# Patient Record
Sex: Female | Born: 1975 | Race: White | Hispanic: No | Marital: Married | Smoking: Current every day smoker
Health system: Southern US, Community
[De-identification: ages and names within clinical notes are randomized; demographics above are authoritative.]

---

## 2020-04-22 ENCOUNTER — Ambulatory Visit
Admission: EM | Admit: 2020-04-22 | Discharge: 2020-04-22 | Disposition: A | Discharge status: Home / Self Care | Payer: Self-pay

## 2020-04-22 ENCOUNTER — Other Ambulatory Visit: Payer: Self-pay

## 2020-04-22 ENCOUNTER — Encounter: Payer: Self-pay | Admitting: Emergency Medicine

## 2020-04-22 DIAGNOSIS — M62830 Muscle spasm of back: Secondary | ICD-10-CM

## 2020-04-22 DIAGNOSIS — M542 Cervicalgia: Secondary | ICD-10-CM

## 2020-04-22 DIAGNOSIS — R519 Headache, unspecified: Secondary | ICD-10-CM

## 2020-04-22 DIAGNOSIS — M549 Dorsalgia, unspecified: Secondary | ICD-10-CM

## 2020-04-22 MED ORDER — MELOXICAM 15 MG PO TABS
15.0000 mg | ORAL_TABLET | Freq: Every day | ORAL | 0 refills | Status: DC
Start: 2020-04-22 — End: 2020-04-25

## 2020-04-22 MED ORDER — KETOROLAC TROMETHAMINE 60 MG/2ML IM SOLN
60.0000 mg | Freq: Once | INTRAMUSCULAR | Status: AC
  Administered 2020-04-22: 60 mg via INTRAMUSCULAR

## 2020-04-22 MED ORDER — CYCLOBENZAPRINE HCL 10 MG PO TABS
10.0000 mg | ORAL_TABLET | Freq: Every day | ORAL | 0 refills | Status: DC
Start: 2020-04-22 — End: 2020-04-25

## 2020-04-22 MED ORDER — PREDNISONE 20 MG PO TABS
20.0000 mg | ORAL_TABLET | Freq: Two times a day (BID) | ORAL | 0 refills | Status: AC
Start: 2020-04-22 — End: 2020-04-27

## 2020-04-22 MED ORDER — DEXAMETHASONE SODIUM PHOSPHATE 10 MG/ML IJ SOLN
10.0000 mg | Freq: Once | INTRAMUSCULAR | Status: AC
  Administered 2020-04-22: 10 mg via INTRAMUSCULAR
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
## 2020-04-25 ENCOUNTER — Ambulatory Visit (HOSPITAL_COMMUNITY): Payer: 59

## 2020-04-25 ENCOUNTER — Ambulatory Visit (HOSPITAL_COMMUNITY)
Admission: EM | Admit: 2020-04-25 | Discharge: 2020-04-25 | Disposition: A | Discharge status: Home / Self Care | Payer: 59 | Attending: Internal Medicine | Admitting: Internal Medicine

## 2020-04-25 ENCOUNTER — Encounter (HOSPITAL_COMMUNITY): Payer: Self-pay

## 2020-04-25 ENCOUNTER — Ambulatory Visit (INDEPENDENT_AMBULATORY_CARE_PROVIDER_SITE_OTHER): Payer: 59

## 2020-04-25 DIAGNOSIS — S161XXA Strain of muscle, fascia and tendon at neck level, initial encounter: Secondary | ICD-10-CM

## 2020-04-25 DIAGNOSIS — M545 Low back pain, unspecified: Secondary | ICD-10-CM

## 2020-04-25 MED ORDER — KETOROLAC TROMETHAMINE 30 MG/ML IJ SOLN
30.0000 mg | Freq: Once | INTRAMUSCULAR | Status: AC
  Administered 2020-04-25: 30 mg via INTRAMUSCULAR

## 2020-04-25 MED ORDER — CYCLOBENZAPRINE HCL 10 MG PO TABS
10.0000 mg | ORAL_TABLET | Freq: Three times a day (TID) | ORAL | 0 refills | Status: DC | PRN
Start: 2020-04-25 — End: 2020-06-07

## 2020-04-25 MED ORDER — HYDROCODONE-ACETAMINOPHEN 5-325 MG PO TABS: 1.0000 | ORAL_TABLET | Freq: Four times a day (QID) | ORAL | 0 refills | Status: AC | PRN

## 2020-04-25 MED ORDER — KETOROLAC TROMETHAMINE 30 MG/ML IJ SOLN
INTRAMUSCULAR | Status: AC
  Filled 2020-04-25: qty 1

## 2020-04-25 MED ORDER — IBUPROFEN 600 MG PO TABS
600.0000 mg | ORAL_TABLET | Freq: Four times a day (QID) | ORAL | 0 refills | Status: DC | PRN
Start: 2020-04-25 — End: 2020-05-16
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
## 2020-04-27 ENCOUNTER — Telehealth: Payer: Self-pay

## 2020-04-27 ENCOUNTER — Other Ambulatory Visit: Payer: Self-pay | Admitting: Chiropractic Medicine

## 2020-04-27 ENCOUNTER — Other Ambulatory Visit (HOSPITAL_COMMUNITY): Payer: Self-pay | Admitting: Chiropractic Medicine

## 2020-04-27 DIAGNOSIS — M5441 Lumbago with sciatica, right side: Secondary | ICD-10-CM
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
## 2020-04-30 ENCOUNTER — Telehealth (HOSPITAL_COMMUNITY): Payer: Self-pay

## 2020-05-04 ENCOUNTER — Ambulatory Visit: Payer: 59 | Admitting: Family Medicine

## 2020-05-04 ENCOUNTER — Other Ambulatory Visit: Payer: Self-pay

## 2020-05-04 ENCOUNTER — Encounter: Payer: Self-pay | Admitting: Family Medicine

## 2020-05-04 DIAGNOSIS — M545 Low back pain, unspecified: Secondary | ICD-10-CM

## 2020-05-04 DIAGNOSIS — M542 Cervicalgia: Secondary | ICD-10-CM | POA: Diagnosis not present

## 2020-05-04 MED ORDER — PREDNISONE 10 MG PO TABS
ORAL_TABLET | ORAL | 0 refills | Status: DC
Start: 2020-05-04 — End: 2020-06-07

## 2020-05-04 MED ORDER — BACLOFEN 10 MG PO TABS: 5.0000 mg | ORAL_TABLET | Freq: Three times a day (TID) | ORAL | 3 refills | Status: DC | PRN

## 2020-05-04 MED ORDER — TRAMADOL HCL 50 MG PO TABS: 50.0000 mg | ORAL_TABLET | Freq: Four times a day (QID) | ORAL | 0 refills | Status: DC | PRN
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
## 2020-05-10 ENCOUNTER — Ambulatory Visit (HOSPITAL_COMMUNITY): Payer: 59

## 2020-05-16 ENCOUNTER — Encounter: Payer: Self-pay | Admitting: Family Medicine

## 2020-05-16 MED ORDER — IBUPROFEN 600 MG PO TABS: 600.0000 mg | ORAL_TABLET | Freq: Three times a day (TID) | ORAL | 1 refills | Status: DC | PRN

## 2020-05-16 MED ORDER — TRAMADOL HCL 50 MG PO TABS: 25.0000 mg | ORAL_TABLET | Freq: Four times a day (QID) | ORAL | 0 refills | Status: DC | PRN

## 2020-05-16 NOTE — Addendum Note (Signed)
Addended by: Lillia Carmel on: 05/16/2020 03:20 PM   Modules accepted: Orders

## 2020-06-02 MED FILL — BACLOFEN 10 MG TABS: 10 | 10 days supply | Qty: 30 | Fill #1

## 2020-06-07 ENCOUNTER — Encounter: Payer: Self-pay | Admitting: Family Medicine

## 2020-06-07 ENCOUNTER — Other Ambulatory Visit: Payer: Self-pay

## 2020-06-07 ENCOUNTER — Ambulatory Visit (INDEPENDENT_AMBULATORY_CARE_PROVIDER_SITE_OTHER): Payer: 59 | Admitting: Family Medicine

## 2020-06-07 DIAGNOSIS — M542 Cervicalgia: Secondary | ICD-10-CM | POA: Diagnosis not present

## 2020-06-07 DIAGNOSIS — M545 Low back pain, unspecified: Secondary | ICD-10-CM

## 2020-06-07 DIAGNOSIS — R102 Pelvic and perineal pain: Secondary | ICD-10-CM

## 2020-06-07 MED ORDER — TRAMADOL HCL 50 MG PO TABS: 25.0000 mg | ORAL_TABLET | Freq: Four times a day (QID) | ORAL | 0 refills | Status: DC | PRN

## 2020-06-07 MED ORDER — PREDNISONE 10 MG PO TABS
ORAL_TABLET | ORAL | 0 refills | Status: DC
Start: 2020-06-07 — End: 2020-08-04

## 2020-06-07 MED FILL — predniSONE 10 MG TABS: 10 | 12 days supply | Qty: 42 | Fill #0

## 2020-06-07 MED FILL — traMADol HCL 50 MG TABS: 50 | 7 days supply | Qty: 30 | Fill #0
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
## 2020-06-09 NOTE — Telephone Encounter (Signed)
done

## 2020-06-10 MED FILL — BACLOFEN 10 MG TABS: 10 | 10 days supply | Qty: 30 | Fill #2

## 2020-06-28 ENCOUNTER — Encounter: Payer: Self-pay | Admitting: Family Medicine

## 2020-06-28 MED ORDER — BACLOFEN 10 MG PO TABS: 5.0000 mg | ORAL_TABLET | Freq: Three times a day (TID) | ORAL | 3 refills | Status: DC | PRN

## 2020-06-28 MED ORDER — TRAMADOL HCL 50 MG PO TABS: 25.0000 mg | ORAL_TABLET | Freq: Four times a day (QID) | ORAL | 0 refills | Status: DC | PRN

## 2020-06-28 MED FILL — BACLOFEN 10 MG TABS: 10 | 10 days supply | Qty: 30 | Fill #0

## 2020-06-28 MED FILL — traMADol HCL 50 MG TABS: 50 | 7 days supply | Qty: 30 | Fill #0

## 2020-06-28 MED FILL — IBUPROFEN 600 MG TABLET: 600 | 30 days supply | Qty: 90 | Fill #1

## 2020-07-21 ENCOUNTER — Encounter: Payer: Self-pay | Admitting: Family Medicine

## 2020-07-22 MED ORDER — DIAZEPAM 5 MG PO TABS: ORAL_TABLET | ORAL | 0 refills | Status: DC

## 2020-07-22 MED FILL — diazePAM 5 MG TABS: 5 | 5 days supply | Qty: 5 | Fill #0

## 2020-07-26 ENCOUNTER — Ambulatory Visit: Payer: 59 | Admitting: Family Medicine

## 2020-08-04 ENCOUNTER — Encounter: Payer: Self-pay | Admitting: Family Medicine

## 2020-08-04 ENCOUNTER — Ambulatory Visit: Payer: 59 | Admitting: Family Medicine

## 2020-08-04 ENCOUNTER — Other Ambulatory Visit: Payer: Self-pay

## 2020-08-04 ENCOUNTER — Other Ambulatory Visit: Payer: Self-pay | Admitting: Family Medicine

## 2020-08-04 DIAGNOSIS — M25512 Pain in left shoulder: Secondary | ICD-10-CM | POA: Diagnosis not present

## 2020-08-04 MED FILL — IBUPROFEN 600 MG TABLET: 600 | 30 days supply | Qty: 90 | Fill #0

## 2020-08-04 MED FILL — BACLOFEN 10 MG TABS: 10 | 10 days supply | Qty: 30 | Fill #1
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
## 2020-08-05 ENCOUNTER — Encounter: Payer: Self-pay | Admitting: Family Medicine

## 2020-08-05 MED ORDER — IBUPROFEN 600 MG PO TABS: 600.0000 mg | ORAL_TABLET | Freq: Three times a day (TID) | ORAL | 1 refills | Status: DC | PRN

## 2020-08-05 MED ORDER — BACLOFEN 10 MG PO TABS: 5.0000 mg | ORAL_TABLET | Freq: Three times a day (TID) | ORAL | 6 refills | Status: DC | PRN

## 2020-08-05 MED ORDER — TRAMADOL HCL 50 MG PO TABS: 25.0000 mg | ORAL_TABLET | Freq: Four times a day (QID) | ORAL | 0 refills | Status: DC | PRN

## 2020-08-05 MED FILL — traMADol HCL 50 MG TABS: 50 | 8 days supply | Qty: 30 | Fill #0

## 2020-08-05 NOTE — Addendum Note (Signed)
Addended by: Lillia Carmel on: 08/05/2020 11:40 AM   Modules accepted: Orders

## 2020-08-08 ENCOUNTER — Encounter: Payer: Self-pay | Admitting: Family Medicine

## 2020-08-08 ENCOUNTER — Telehealth: Payer: Self-pay

## 2020-08-08 ENCOUNTER — Other Ambulatory Visit: Payer: Self-pay

## 2020-08-08 DIAGNOSIS — M25512 Pain in left shoulder: Secondary | ICD-10-CM

## 2020-08-08 NOTE — Telephone Encounter (Signed)
Called and spoke with patient. She is going to contact [facility] and get a disc containing the MRI of her left shoulder, along with the report. She is going to drop this off for Dr.Dean and Dr.Hilts. She is aware that a referral has been placed for Dr.Dean, and should expect a call for scheduling.

## 2020-08-25 ENCOUNTER — Ambulatory Visit: Payer: 59 | Admitting: Orthopedic Surgery

## 2020-08-25 DIAGNOSIS — M79602 Pain in left arm: Secondary | ICD-10-CM | POA: Diagnosis not present

## 2020-08-26 ENCOUNTER — Encounter: Payer: Self-pay | Admitting: Orthopedic Surgery
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
## 2020-08-29 ENCOUNTER — Other Ambulatory Visit: Payer: Self-pay | Admitting: Family Medicine

## 2020-08-29 MED FILL — BACLOFEN 10 MG TABS: 10 | 10 days supply | Qty: 30 | Fill #2

## 2020-08-29 MED FILL — IBUPROFEN 600 MG TABLET: 600 | 30 days supply | Qty: 90 | Fill #1

## 2020-08-29 MED FILL — traMADol HCL 50 MG TABS: 50 | 7 days supply | Qty: 30 | Fill #0

## 2020-09-16 ENCOUNTER — Ambulatory Visit
Admission: RE | Admit: 2020-09-16 | Discharge: 2020-09-16 | Disposition: A | Discharge status: Home / Self Care | Payer: 59 | Source: Ambulatory Visit | Attending: Orthopedic Surgery | Admitting: Orthopedic Surgery

## 2020-09-16 DIAGNOSIS — M79602 Pain in left arm: Secondary | ICD-10-CM

## 2020-09-19 ENCOUNTER — Ambulatory Visit: Payer: 59 | Admitting: Orthopedic Surgery

## 2020-09-19 ENCOUNTER — Ambulatory Visit: Payer: Self-pay

## 2020-09-19 DIAGNOSIS — M25512 Pain in left shoulder: Secondary | ICD-10-CM | POA: Diagnosis not present

## 2020-09-19 NOTE — Progress Notes (Signed)
Subjective: Patient is here for ultrasound-guided intra-articular left glenohumeral injection.    Objective:  Pain with extremes of movement.  Procedure: Ultrasound-guided left glenohumeral injection: After sterile prep with Betadine, injected 8 cc 1% lidocaine without epinephrine and 6 mg betamethasone using a 22-gauge spinal needle, passing the needle from posterior approach into the glenohumeral joint.  Injectate seen filling joint capsule.

## 2020-09-21 ENCOUNTER — Encounter: Payer: Self-pay | Admitting: Orthopedic Surgery
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
## 2020-09-26 ENCOUNTER — Other Ambulatory Visit: Payer: Self-pay | Admitting: Family Medicine

## 2020-09-26 ENCOUNTER — Encounter: Payer: Self-pay | Admitting: Family Medicine

## 2020-09-26 ENCOUNTER — Encounter: Payer: Self-pay | Admitting: Orthopedic Surgery

## 2020-09-26 MED ORDER — TRAMADOL HCL 50 MG PO TABS: ORAL_TABLET | ORAL | 0 refills | Status: DC

## 2020-09-26 MED ORDER — BACLOFEN 10 MG PO TABS: 5.0000 mg | ORAL_TABLET | Freq: Three times a day (TID) | ORAL | 6 refills | Status: DC | PRN

## 2020-09-26 MED FILL — BACLOFEN 10 MG TABS: 10 | 10 days supply | Qty: 30 | Fill #0

## 2020-09-26 MED FILL — traMADol HCL 50 MG TABS: 50 | 8 days supply | Qty: 30 | Fill #0

## 2020-09-27 NOTE — Telephone Encounter (Signed)
Diagnostic arthroscopy will be  the the next step but that is no guarantee that we will find anything that is actionable.

## 2020-09-30 NOTE — Telephone Encounter (Signed)
Hi Julia Walter can you fill out a blue sheet for her which is left shoulder arthroscopy possible labral repair versus debridement.  Possible biceps tenodesis that would be in the lateral position with Arthrex for 90 minutes CPT code 16109.  Luke assist.  Thanks added CPT code 60454

## 2020-10-18 ENCOUNTER — Other Ambulatory Visit: Payer: Self-pay | Admitting: Family Medicine

## 2020-10-18 ENCOUNTER — Encounter: Payer: Self-pay | Admitting: Family Medicine

## 2020-10-18 MED ORDER — IBUPROFEN 600 MG PO TABS: 600.0000 mg | ORAL_TABLET | Freq: Three times a day (TID) | ORAL | 3 refills | Status: DC | PRN

## 2020-10-18 MED ORDER — TRAMADOL HCL 50 MG PO TABS: ORAL_TABLET | ORAL | 0 refills | Status: DC

## 2020-10-18 MED FILL — IBUPROFEN 600 MG TABLET: 600 | 30 days supply | Qty: 90 | Fill #0

## 2020-10-18 MED FILL — traMADol HCL 50 MG TABS: 50 | 8 days supply | Qty: 30 | Fill #0

## 2020-10-19 MED FILL — BACLOFEN 10 MG TABS: 10 | 10 days supply | Qty: 30 | Fill #1

## 2020-10-24 ENCOUNTER — Other Ambulatory Visit: Payer: Self-pay | Admitting: Surgical

## 2020-10-24 ENCOUNTER — Encounter: Payer: Self-pay | Admitting: Orthopedic Surgery

## 2020-10-24 DIAGNOSIS — S43492D Other sprain of left shoulder joint, subsequent encounter: Secondary | ICD-10-CM | POA: Diagnosis not present

## 2020-10-24 MED ORDER — OXYCODONE-ACETAMINOPHEN 5-325 MG PO TABS: 1.0000 | ORAL_TABLET | ORAL | 0 refills | Status: DC | PRN

## 2020-10-24 MED ORDER — ONDANSETRON HCL 4 MG PO TABS: 4.0000 mg | ORAL_TABLET | Freq: Every day | ORAL | 1 refills | Status: DC | PRN

## 2020-10-24 MED FILL — OXYCODONE-APAP 5-325MG: 5-325 | 5 days supply | Qty: 30 | Fill #0

## 2020-10-24 MED FILL — ONDANSETRON HCL 4 MG TABLET: 4 | 30 days supply | Qty: 30 | Fill #0

## 2020-10-31 ENCOUNTER — Ambulatory Visit (INDEPENDENT_AMBULATORY_CARE_PROVIDER_SITE_OTHER): Payer: 59 | Admitting: Orthopedic Surgery

## 2020-10-31 DIAGNOSIS — M25512 Pain in left shoulder: Secondary | ICD-10-CM

## 2020-11-02 ENCOUNTER — Other Ambulatory Visit: Payer: Self-pay | Admitting: Family Medicine

## 2020-11-02 MED FILL — traMADol HCL 50 MG TABS: 50 | 8 days supply | Qty: 30 | Fill #0

## 2020-11-02 MED FILL — BACLOFEN 10 MG TABS: 10 | 10 days supply | Qty: 30 | Fill #2

## 2020-11-05 ENCOUNTER — Encounter: Payer: Self-pay | Admitting: Orthopedic Surgery

## 2020-11-05 NOTE — Progress Notes (Signed)
   Post-Op Visit Note   Patient: Julia Walter           Date of Birth: [birth year]           MRN: [id number] Visit Date: 10/31/2020 PCP: Patient, No Pcp Per   Assessment & Plan:  Chief Complaint:  Chief Complaint  Patient presents with  . Left Shoulder - Routine Post Op   Visit Diagnoses: No diagnosis found.  Plan: Patient is a 44 year old female presents s/p left shoulder arthroscopy with labral repair on 10/24/2020.  She discontinued oxycodone on Friday and has transition to using tramadol for pain control.  She is having difficulty with constipation and has been taking Colace.  She has had 3 bowel movements since the surgery with one this morning.  Recommended she try MiraLAX if she has continued difficulty with voiding her bowels.  She has remained in sling and not lifting anything since the procedure.  Incisions are healing well with sutures intact.  Sutures were removed and replaced with Steri-Strips.  Axillary nerve is intact with deltoid firing.  Shoulder has 45 degrees external rotation, 90 degrees abduction, 130 degrees forward flexion on exam.  Stable to anterior and posterior directed forces.  Plan to refill patient's prescription for tramadol and have her follow-up in 2 weeks for clinical recheck.  Use the sling at all times until then.  No lifting.  Follow-Up Instructions: No follow-ups on file.   Orders:  No orders of the defined types were placed in this encounter.  No orders of the defined types were placed in this encounter.   Imaging: No results found.  PMFS History: There are no problems to display for this patient.  No past medical history on file.  Family History  Problem Relation Age of Onset  . Cancer Mother   . Diabetes Mother   . Heart failure Father   . Diabetes Father     No past surgical history on file. Social History   Occupational History  . Not on file  Tobacco Use  . Smoking status: Current Every Day Smoker    Packs/day: 1.00     Types: Cigarettes  . Smokeless tobacco: Never Used  Vaping Use  . Vaping Use: Never used  Substance and Sexual Activity  . Alcohol use: Never  . Drug use: Never  . Sexual activity: Yes

## 2020-11-16 ENCOUNTER — Ambulatory Visit (INDEPENDENT_AMBULATORY_CARE_PROVIDER_SITE_OTHER): Payer: 59 | Admitting: Orthopedic Surgery

## 2020-11-16 ENCOUNTER — Other Ambulatory Visit: Payer: Self-pay | Admitting: Family Medicine

## 2020-11-16 ENCOUNTER — Encounter: Payer: Self-pay | Admitting: Orthopedic Surgery

## 2020-11-16 ENCOUNTER — Other Ambulatory Visit: Payer: Self-pay

## 2020-11-16 ENCOUNTER — Encounter: Payer: Self-pay | Admitting: Family Medicine

## 2020-11-16 DIAGNOSIS — M25512 Pain in left shoulder: Secondary | ICD-10-CM

## 2020-11-16 MED ORDER — TRAMADOL HCL 50 MG PO TABS: ORAL_TABLET | ORAL | 0 refills | Status: DC

## 2020-11-16 MED FILL — BACLOFEN 10 MG TABS: 10 | 10 days supply | Qty: 30 | Fill #3

## 2020-11-16 MED FILL — traMADol HCL 50 MG TABS: 50 | 8 days supply | Qty: 30 | Fill #0

## 2020-11-16 MED FILL — IBUPROFEN 600 MG TABLET: 600 | 30 days supply | Qty: 90 | Fill #1

## 2020-11-19 ENCOUNTER — Encounter: Payer: Self-pay | Admitting: Orthopedic Surgery

## 2020-11-19 NOTE — Progress Notes (Signed)
   Post-Op Visit Note   Patient: Julia Walter           Date of Birth: [birth year]           MRN: [id number] Visit Date: 11/16/2020 PCP: Patient, No Pcp Per   Assessment & Plan:  Chief Complaint:  Chief Complaint  Patient presents with  . Left Shoulder - Pain   Visit Diagnoses:  1. Acute pain of left shoulder     Plan: Genisis is a 44 year old patient underwent left shoulder labral repair of the anterior superior labral tear 10/24/2020.  Pain comes and goes.  Taking tramadol baclofen and ibuprofen.  Using a sling.  Has some occasional clicking in the shoulder.  On examination she has good rotator cuff strength and fairly reasonable passive range of motion.  Plan here is to start physical therapy here 2 times a week for active assisted range of motion below shoulder level plus isometric strengthening.  No overhead motion yet until she is 6 weeks postop.  Come back in 3 weeks for clinical recheck and initiation of overhead motion at that time.  Follow-Up Instructions: Return in about 3 weeks (around 12/07/2020).   Orders:  Orders Placed This Encounter  Procedures  . Ambulatory referral to Physical Therapy   No orders of the defined types were placed in this encounter.   Imaging: No results found.  PMFS History: There are no problems to display for this patient.  History reviewed. No pertinent past medical history.  Family History  Problem Relation Age of Onset  . Cancer Mother   . Diabetes Mother   . Heart failure Father   . Diabetes Father     History reviewed. No pertinent surgical history. Social History   Occupational History  . Not on file  Tobacco Use  . Smoking status: Current Every Day Smoker    Packs/day: 1.00    Types: Cigarettes  . Smokeless tobacco: Never Used  Vaping Use  . Vaping Use: Never used  Substance and Sexual Activity  . Alcohol use: Never  . Drug use: Never  . Sexual activity: Yes

## 2020-11-23 ENCOUNTER — Other Ambulatory Visit: Payer: Self-pay

## 2020-11-23 ENCOUNTER — Ambulatory Visit (INDEPENDENT_AMBULATORY_CARE_PROVIDER_SITE_OTHER): Payer: 59 | Admitting: Rehabilitative and Restorative Service Providers"

## 2020-11-23 DIAGNOSIS — M25512 Pain in left shoulder: Secondary | ICD-10-CM | POA: Diagnosis not present

## 2020-11-23 DIAGNOSIS — R600 Localized edema: Secondary | ICD-10-CM | POA: Diagnosis not present

## 2020-11-23 DIAGNOSIS — M6281 Muscle weakness (generalized): Secondary | ICD-10-CM

## 2020-11-23 NOTE — Patient Instructions (Signed)
Access Code: [email or web address] URL: [email or web address] Date: 11/23/2020 Prepared by: Margretta Ditty  Program Notes FOR ALL ACTIVITY:  Avoid overhead reaching, no weight bearing through the arm, and avoid lifting.   Exercises Supine Shoulder Press AAROM in Abduction with Dowel - 2 x daily - 7 x weekly - 1 sets - 10 reps Supine Isometric Shoulder Extension with Towel - 2 x daily - 7 x weekly - 1 sets - 10 reps - 3 seconds hold Standing Scapular Retraction - 2 x daily - 7 x weekly - 1 sets - 10 reps - 3 seconds hold
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
## 2020-11-28 ENCOUNTER — Other Ambulatory Visit: Payer: Self-pay | Admitting: Family Medicine

## 2020-11-28 ENCOUNTER — Encounter: Payer: Self-pay | Admitting: Family Medicine

## 2020-11-29 ENCOUNTER — Other Ambulatory Visit: Payer: Self-pay | Admitting: Family Medicine

## 2020-11-29 MED FILL — traMADol HCL 50 MG TABS: 50 | 8 days supply | Qty: 30 | Fill #0

## 2020-11-29 MED FILL — BACLOFEN 10 MG TABS: 10 | 10 days supply | Qty: 30 | Fill #4

## 2020-12-01 ENCOUNTER — Encounter: Payer: Self-pay | Admitting: Rehabilitative and Restorative Service Providers"

## 2020-12-01 ENCOUNTER — Other Ambulatory Visit: Payer: Self-pay

## 2020-12-01 ENCOUNTER — Ambulatory Visit (INDEPENDENT_AMBULATORY_CARE_PROVIDER_SITE_OTHER): Payer: 59 | Admitting: Rehabilitative and Restorative Service Providers"

## 2020-12-01 DIAGNOSIS — R600 Localized edema: Secondary | ICD-10-CM | POA: Diagnosis not present

## 2020-12-01 DIAGNOSIS — M25512 Pain in left shoulder: Secondary | ICD-10-CM | POA: Diagnosis not present

## 2020-12-01 DIAGNOSIS — M6281 Muscle weakness (generalized): Secondary | ICD-10-CM | POA: Diagnosis not present

## 2020-12-01 NOTE — Therapy (Signed)
[facility] [place] [address] [place], [place], [address] Phone: [phone]   Fax:  [phone]  Physical Therapy Treatment  Patient Details  Name: Julia Walter MRN: [id number] Date of Birth: 1976/09/15 Referring Provider (PT): Cammy Copa, MD   Encounter Date: 12/01/2020   PT End of Session - 12/01/20 1225    Visit Number 2    Number of Visits 16    Date for PT Re-Evaluation 01/22/21    PT Start Time 1148    PT Stop Time 1230    PT Time Calculation (min) 42 min    Activity Tolerance Patient tolerated treatment well    Behavior During Therapy [facility] for tasks assessed/performed           History reviewed. No pertinent past medical history.  History reviewed. No pertinent surgical history.  There were no vitals filed for this visit.   Subjective Assessment - 12/01/20 1150    Subjective The patient reports that she is feeling better with L shoulder.  Exercises are going well at home.    Patient Stated Goals "I wouldlike to have a real life again."    Currently in Pain? Yes    Pain Score 2     Pain Location Shoulder    Pain Orientation Left    Pain Descriptors / Indicators Sore    Pain Onset More than a month ago    Pain Frequency Constant    Aggravating Factors  improved this week    Pain Relieving Factors ice              [facility] PT Assessment - 12/01/20 1152      Assessment   Medical Diagnosis Labral Repair L shoulder    Referring Provider (PT) Cammy Copa, MD    Onset Date/Surgical Date 10/24/20    Hand Dominance Right      Precautions   Precautions Shoulder    Type of Shoulder Precautions no OH lifting, AAROM <90, isometric strengthening only      PROM   Left Shoulder Flexion 90 Degrees    Left Shoulder External Rotation 20 Degrees   iin scapular plane                        [facility] Adult PT Treatment/Exercise - 12/01/20 1152      Exercises   Exercises  Shoulder;Elbow      Elbow Exercises   Elbow Flexion Limitations elbow flexion/extension supine x 10 reps      Shoulder Exercises: Supine   External Rotation AAROM;Left;10 reps    External Rotation Limitations in scapular plane with limited ROM (goes to 15-20 deg and PT recommends no pain/ gentle stretch only    Internal Rotation Left;AAROM;5 reps    Flexion AAROM;Both;10 reps;Left    Other Supine Exercises isometric extension L x 5 reps with 5 second holds      Shoulder Exercises: Standing   Retraction Strengthening;Both;10 reps      Shoulder Exercises: Isometric Strengthening   Flexion 5X5"    Extension 5X5"    Internal Rotation 5X5"    Other Isometric Exercises In standing near door frame      Manual Therapy   Manual Therapy Passive ROM    Passive ROM to improve ROM to 90 degrees                  PT Education - 12/01/20 1222    Education Details progression HEP  Person(s) Educated Patient    Methods Explanation;Demonstration;Handout    Comprehension Verbalized understanding;Returned demonstration            PT Short Term Goals - 11/23/20 1049      PT SHORT TERM GOAL #1   Title The patient will return demo HEP for AAROM to 90 degrees and isometric strengthening.    Time 4    Period Weeks    Target Date 12/23/20      PT SHORT TERM GOAL #2   Title The patient will report no pain at rest.    Baseline 5/10 baseline    Time 6    Period Weeks    Target Date 12/23/20      PT SHORT TERM GOAL #3   Title The patient will tolerate 90 degrees AAROM without shoulder pain.    Time 6    Period Weeks    Target Date 12/23/20             PT Long Term Goals - 11/23/20 1051      PT LONG TERM GOAL #1   Title The patient will be indep with HEP progression within parameters of protocol.    Time 8    Period Weeks    Target Date 01/22/21      PT LONG TERM GOAL #2   Title The patient will be able to return to daily household activities (folding laundry, light  cleaning) wiht pain in L shoulder < or equal to 2/10.    Time 8    Period Weeks    Target Date 01/22/21      PT LONG TERM GOAL #3   Title The patient will reduce functional limitation from FOTO from 75% to < or equal to 39%.    Time 8    Period Weeks    Target Date 01/22/21      PT LONG TERM GOAL #4   Title The patient will reach to an overhead shelf without c/o L shoulder pain.    Time 8    Period Weeks    Target Date 01/22/21      PT LONG TERM GOAL #5   Title The patient will be able to reach behind her head for self care without L shoulder pain (as protocol allows).    Time 8    Period Weeks    Target Date 01/22/21                 Plan - 12/01/20 1226    Clinical Impression Statement The patient has improved mobility today with flexion and ER.  PT progressed HEP to add isometric strengthening at neutral.  Plan to continue working to LTGs.    PT Treatment/Interventions ADLs/Self Care Home Management;Patient/family education;Therapeutic exercise;Therapeutic activities;Taping;Manual techniques;Dry needling;Vasopneumatic Device;Electrical Stimulation;Cryotherapy    PT Next Visit Plan follow protocol performing AAROM to 90, no overhead, only isometric strengthening    PT Home Exercise Plan [email or web address]    Consulted and Agree with Plan of Care Patient           Patient will benefit from skilled therapeutic intervention in order to improve the following deficits and impairments:     Visit Diagnosis: Acute pain of left shoulder  Muscle weakness (generalized)  Localized edema     Problem List There are no problems to display for this patient.   Julia Walter, PT 12/01/2020, 12:27 PM  [facility] [place] [address] [place], [place], [address] Phone: [phone]  Fax:  [phone]  Name: Julia Walter MRN: [id number] Date of Birth: [birth year]

## 2020-12-01 NOTE — Patient Instructions (Signed)
Access Code: [email or web address] URL: [email or web address] Date: 12/01/2020 Prepared by: Margretta Ditty  Program Notes FOR ALL ACTIVITY:  Avoid overhead reaching, no weight bearing through the arm, and avoid lifting. For strengthening exercises do them 3-4 days/week 1x/day. For stretching and range of motion, perform daily to tolerance.   Exercises Standing Scapular Retraction - 2 x daily - 7 x weekly - 1 sets - 10 reps - 3 seconds hold Circular Shoulder Pendulum with Table Support - 2 x daily - 7 x weekly - 1 sets - 10 reps Seated Upper Trapezius Stretch - 2 x daily - 7 x weekly - 1 sets - 2 reps - 30 seconds hold Supine Shoulder Press AAROM in Abduction with Dowel - 2 x daily - 7 x weekly - 1 sets - 10 reps Supine Isometric Shoulder Extension with Towel - 2 x daily - 7 x weekly - 1 sets - 10 reps - 3 seconds hold Supine Shoulder External Rotation with Dowel - 2 x daily - 7 x weekly - 1 sets - 10 reps Isometric Shoulder Flexion at Wall - 2 x daily - 7 x weekly - 1 sets - 5 reps - 5 seocnds hold Standing Isometric Shoulder Internal Rotation at Doorway - 1 x daily - 7 x weekly - 1 sets - 5 reps - 5 seconds hold Isometric Shoulder Extension at Wall - 1 x daily - 7 x weekly - 1 sets - 5 reps - 5 seconds hold

## 2020-12-07 ENCOUNTER — Ambulatory Visit (INDEPENDENT_AMBULATORY_CARE_PROVIDER_SITE_OTHER): Payer: 59 | Admitting: Surgical

## 2020-12-07 ENCOUNTER — Encounter: Payer: Self-pay | Admitting: Surgical

## 2020-12-07 ENCOUNTER — Other Ambulatory Visit: Payer: Self-pay

## 2020-12-07 ENCOUNTER — Other Ambulatory Visit: Payer: Self-pay | Admitting: Family Medicine

## 2020-12-07 ENCOUNTER — Other Ambulatory Visit: Payer: Self-pay | Admitting: Surgical

## 2020-12-07 ENCOUNTER — Ambulatory Visit (INDEPENDENT_AMBULATORY_CARE_PROVIDER_SITE_OTHER): Payer: 59 | Admitting: Rehabilitative and Restorative Service Providers"

## 2020-12-07 ENCOUNTER — Encounter: Payer: Self-pay | Admitting: Family Medicine

## 2020-12-07 DIAGNOSIS — M25512 Pain in left shoulder: Secondary | ICD-10-CM

## 2020-12-07 DIAGNOSIS — M6281 Muscle weakness (generalized): Secondary | ICD-10-CM

## 2020-12-07 DIAGNOSIS — R600 Localized edema: Secondary | ICD-10-CM

## 2020-12-07 MED ORDER — TRAMADOL HCL 50 MG PO TABS: 50.0000 mg | ORAL_TABLET | Freq: Two times a day (BID) | ORAL | 0 refills | Status: DC | PRN

## 2020-12-07 MED FILL — BACLOFEN 10 MG TABS: 10 | 10 days supply | Qty: 30 | Fill #5

## 2020-12-07 NOTE — Progress Notes (Signed)
   Post-Op Visit Note   Patient: Julia Walter           Date of Birth: 07-09-1976           MRN: [id number] Visit Date: 12/07/2020 PCP: Patient, No Pcp Per   Assessment & Plan:  Chief Complaint:  Chief Complaint  Patient presents with  . Other    Post op follow up   Visit Diagnoses:  1. Acute pain of left shoulder     Plan: Patient is a 44 year old female presents s/p left shoulder labral repair on 10/24/2020.  She is doing well and notes slow but steady progress in the right direction.  She has good and bad days and feels that she overdid it on Christmas with cooking and has had some increased pain since then that seems to be slowly improving.  She takes Advil 600 mg as well as tramadol and baclofen for pain control.  She is going to physical therapy 1-2 times per week in [place].  She started strength training recently but is still working on range of motion and therapy and has not started going overhead yet.  Denies any instability events, fevers, chills, drainage from the incisions.  On exam she has 40 degrees external rotation, 70 degrees abduction, 110 degrees forward flexion.  Excellent stability of the shoulder both anteriorly and posteriorly.  Incisions are healing well.  Plan for patient to start overhead motion.  Do not use sling any longer, which she has discontinued anyway.  Continue working on range of motion passively and actively with physical therapy.  Follow-up in 4 weeks with Dr. August Saucer for clinical recheck.  Follow-Up Instructions: No follow-ups on file.   Orders:  No orders of the defined types were placed in this encounter.  No orders of the defined types were placed in this encounter.   Imaging: No results found.  PMFS History: There are no problems to display for this patient.  No past medical history on file.  Family History  Problem Relation Age of Onset  . Cancer Mother   . Diabetes Mother   . Heart failure Father   . Diabetes Father      No past surgical history on file. Social History   Occupational History  . Not on file  Tobacco Use  . Smoking status: Current Every Day Smoker    Packs/day: 1.00    Types: Cigarettes  . Smokeless tobacco: Never Used  Vaping Use  . Vaping Use: Never used  Substance and Sexual Activity  . Alcohol use: Never  . Drug use: Never  . Sexual activity: Yes
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
## 2020-12-07 NOTE — Telephone Encounter (Signed)
Sent in refill

## 2020-12-08 ENCOUNTER — Other Ambulatory Visit: Payer: Self-pay | Admitting: Family Medicine

## 2020-12-08 MED FILL — traMADol HCL 50 MG TABS: 50 | 8 days supply | Qty: 30 | Fill #0

## 2020-12-13 ENCOUNTER — Encounter: Payer: 59 | Admitting: Rehabilitative and Restorative Service Providers"

## 2020-12-16 ENCOUNTER — Other Ambulatory Visit: Payer: Self-pay

## 2020-12-16 ENCOUNTER — Ambulatory Visit (INDEPENDENT_AMBULATORY_CARE_PROVIDER_SITE_OTHER): Payer: 59 | Admitting: Rehabilitative and Restorative Service Providers"

## 2020-12-16 DIAGNOSIS — M6281 Muscle weakness (generalized): Secondary | ICD-10-CM

## 2020-12-16 DIAGNOSIS — M25512 Pain in left shoulder: Secondary | ICD-10-CM | POA: Diagnosis not present

## 2020-12-16 DIAGNOSIS — R600 Localized edema: Secondary | ICD-10-CM | POA: Diagnosis not present

## 2020-12-16 NOTE — Patient Instructions (Signed)
Access Code: [email or web address] URL: [email or web address] Date: 12/16/2020 Prepared by: Margretta Ditty  Program Notes FOR ALL ACTIVITY:  Avoid overhead reaching, no weight bearing through the arm, and avoid lifting. For strengthening exercises do them 3-4 days/week 1x/day. For stretching and range of motion, perform daily to tolerance.   Exercises Isometric Shoulder Extension at Wall - 1 x daily - 7 x weekly - 1 sets - 5 reps - 5 seconds hold Standing Isometric Shoulder Internal Rotation at Doorway - 1 x daily - 7 x weekly - 1 sets - 5 reps - 5 seconds hold Circular Shoulder Pendulum with Table Support - 2 x daily - 7 x weekly - 1 sets - 10 reps Seated Upper Trapezius Stretch - 2 x daily - 7 x weekly - 1 sets - 2 reps - 30 seconds hold Supine Shoulder Flexion Extension AAROM with Dowel - 2 x daily - 7 x weekly - 1 sets - 10 reps Supine Shoulder External Rotation with Dowel - 2 x daily - 7 x weekly - 1 sets - 10 reps Supine Single Arm Shoulder Protraction - 2 x daily - 7 x weekly - 1 sets - 10 reps Prone Scapular Retraction - 2 x daily - 7 x weekly - 1 sets - 10 reps
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
## 2020-12-20 ENCOUNTER — Ambulatory Visit (INDEPENDENT_AMBULATORY_CARE_PROVIDER_SITE_OTHER): Payer: 59 | Admitting: Rehabilitative and Restorative Service Providers"

## 2020-12-20 ENCOUNTER — Other Ambulatory Visit: Payer: Self-pay

## 2020-12-20 DIAGNOSIS — M6281 Muscle weakness (generalized): Secondary | ICD-10-CM

## 2020-12-20 DIAGNOSIS — M25512 Pain in left shoulder: Secondary | ICD-10-CM | POA: Diagnosis not present

## 2020-12-20 DIAGNOSIS — R600 Localized edema: Secondary | ICD-10-CM

## 2020-12-20 NOTE — Patient Instructions (Signed)
Access Code: [email or web address] URL: [email or web address] Date: 12/20/2020 Prepared by: Margretta Ditty  Program Notes FOR ALL ACTIVITY:  Avoid overhead reaching, no weight bearing through the arm, and avoid lifting. For strengthening exercises do them 3-4 days/week 1x/day. For stretching and range of motion, perform daily to tolerance.   Exercises Shoulder Internal Rotation Reactive Isometrics - 2 x daily - 7 x weekly - 1 sets - 10 reps Shoulder External Rotation Reactive Isometrics - 2 x daily - 7 x weekly - 1 sets - 10 reps Isometric Shoulder Extension at Wall - 1 x daily - 7 x weekly - 1 sets - 5 reps - 5 seconds hold Standing Isometric Shoulder Internal Rotation at Doorway - 1 x daily - 7 x weekly - 1 sets - 5 reps - 5 seconds hold Standing Elbow Flexion Extension AROM - 2 x daily - 7 x weekly - 1 sets - 10 reps Circular Shoulder Pendulum with Table Support - 2 x daily - 7 x weekly - 1 sets - 10 reps Seated Upper Trapezius Stretch - 2 x daily - 7 x weekly - 1 sets - 2 reps - 30 seconds hold Supine Shoulder Flexion Extension AAROM with Dowel - 2 x daily - 7 x weekly - 1 sets - 10 reps Supine Shoulder External Rotation with Dowel - 2 x daily - 7 x weekly - 1 sets - 10 reps Supine Single Arm Shoulder Protraction - 2 x daily - 7 x weekly - 1 sets - 10 reps Prone Scapular Retraction - 2 x daily - 7 x weekly - 1 sets - 10 reps
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
## 2020-12-21 MED FILL — BACLOFEN 10 MG TABS: 10 | 10 days supply | Qty: 30 | Fill #6

## 2020-12-21 MED FILL — IBUPROFEN 600 MG TABLET: 600 | 30 days supply | Qty: 90 | Fill #2

## 2020-12-21 MED FILL — traMADol HCL 50 MG TABS: 50 | 15 days supply | Qty: 30 | Fill #0

## 2020-12-23 ENCOUNTER — Ambulatory Visit (INDEPENDENT_AMBULATORY_CARE_PROVIDER_SITE_OTHER): Payer: 59 | Admitting: Rehabilitative and Restorative Service Providers"

## 2020-12-23 ENCOUNTER — Encounter: Payer: Self-pay | Admitting: Rehabilitative and Restorative Service Providers"

## 2020-12-23 ENCOUNTER — Other Ambulatory Visit: Payer: Self-pay

## 2020-12-23 DIAGNOSIS — R600 Localized edema: Secondary | ICD-10-CM | POA: Diagnosis not present

## 2020-12-23 DIAGNOSIS — M25512 Pain in left shoulder: Secondary | ICD-10-CM | POA: Diagnosis not present

## 2020-12-23 DIAGNOSIS — M6281 Muscle weakness (generalized): Secondary | ICD-10-CM | POA: Diagnosis not present
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
## 2020-12-27 ENCOUNTER — Encounter: Payer: 59 | Admitting: Physical Therapy

## 2020-12-30 ENCOUNTER — Encounter: Payer: 59 | Admitting: Physical Therapy

## 2021-01-03 ENCOUNTER — Other Ambulatory Visit: Payer: Self-pay

## 2021-01-03 ENCOUNTER — Encounter: Payer: Self-pay | Admitting: Physical Therapy

## 2021-01-03 ENCOUNTER — Ambulatory Visit (INDEPENDENT_AMBULATORY_CARE_PROVIDER_SITE_OTHER): Payer: 59 | Admitting: Physical Therapy

## 2021-01-03 DIAGNOSIS — M25512 Pain in left shoulder: Secondary | ICD-10-CM | POA: Diagnosis not present

## 2021-01-03 DIAGNOSIS — M6281 Muscle weakness (generalized): Secondary | ICD-10-CM
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
## 2021-01-03 NOTE — Patient Instructions (Addendum)
Elbow Press    Interlace fingers; bring hands underneath head. Press elbows down. Hold __20_ seconds. Relax arms. Repeat _3__ times.  Flexion (Passive)    Sitting upright, slide forearm forward along table, bending from the waist until a stretch is felt. Hold _30___ seconds. Repeat __3__ times. Do _2___ sessions per day.  Copyright  VHI. All rights reserved.  External Rotation (Passive)    With elbow bent and forearm on table, palm down, bend forward at waist until a stretch is felt. Hold ___30_ seconds. Repeat __3__ times. Do _2___ sessions per day.  TENS UNIT: This is helpful for muscle pain and spasm.   Search and Purchase a TENS 7000 2nd edition at www.tenspros.com. It should be less than $30.     TENS unit instructions: Do not shower or bathe with the unit on Turn the unit off before removing electrodes or batteries If the electrodes lose stickiness add a drop of water to the electrodes after they are disconnected from the unit and place on plastic sheet. If you continued to have difficulty, call the TENS unit company to purchase more electrodes. Do not apply lotion on the skin area prior to use. Make sure the skin is clean and dry as this will help prolong the life of the electrodes. After use, always check skin for unusual red areas, rash or other skin difficulties. If there are any skin problems, does not apply electrodes to the same area. Never remove the electrodes from the unit by pulling the wires. Do not use the TENS unit or electrodes other than as directed. Do not change electrode placement without consultating your therapist or physician. Keep 2 fingers with between each electrode. Wear time ratio is 2:1, on to off times.    For example on for 30 minutes off for 15 minutes and then on for 30 minutes off for 15 minutes

## 2021-01-04 ENCOUNTER — Encounter: Payer: Self-pay | Admitting: Family Medicine

## 2021-01-04 ENCOUNTER — Ambulatory Visit (INDEPENDENT_AMBULATORY_CARE_PROVIDER_SITE_OTHER): Payer: 59 | Admitting: Orthopedic Surgery

## 2021-01-04 ENCOUNTER — Other Ambulatory Visit: Payer: Self-pay | Admitting: Family Medicine

## 2021-01-04 DIAGNOSIS — M25512 Pain in left shoulder: Secondary | ICD-10-CM

## 2021-01-04 MED ORDER — BACLOFEN 10 MG PO TABS: 5.0000 mg | ORAL_TABLET | Freq: Three times a day (TID) | ORAL | 6 refills | Status: DC | PRN

## 2021-01-04 MED ORDER — TRAMADOL HCL 50 MG PO TABS: ORAL_TABLET | ORAL | 0 refills | Status: DC

## 2021-01-04 MED FILL — BACLOFEN 10 MG TABS: 10 | 10 days supply | Qty: 30 | Fill #0

## 2021-01-04 MED FILL — traMADol HCL 50 MG TABS: 50 | 10 days supply | Qty: 30 | Fill #0

## 2021-01-05 ENCOUNTER — Ambulatory Visit (INDEPENDENT_AMBULATORY_CARE_PROVIDER_SITE_OTHER): Payer: 59 | Admitting: Rehabilitative and Restorative Service Providers"

## 2021-01-05 ENCOUNTER — Encounter: Payer: Self-pay | Admitting: Orthopedic Surgery

## 2021-01-05 ENCOUNTER — Encounter: Payer: Self-pay | Admitting: Rehabilitative and Restorative Service Providers"

## 2021-01-05 ENCOUNTER — Other Ambulatory Visit: Payer: Self-pay

## 2021-01-05 DIAGNOSIS — R600 Localized edema: Secondary | ICD-10-CM | POA: Diagnosis not present

## 2021-01-05 DIAGNOSIS — M25512 Pain in left shoulder: Secondary | ICD-10-CM | POA: Diagnosis not present

## 2021-01-05 DIAGNOSIS — M6281 Muscle weakness (generalized): Secondary | ICD-10-CM | POA: Diagnosis not present
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
## 2021-01-05 NOTE — Progress Notes (Signed)
   Post-Op Visit Note   Patient: Julia Walter           Date of Birth: 1976-03-11           MRN: [id number] Visit Date: 01/04/2021 PCP: Patient, No Pcp Per   Assessment & Plan:  Chief Complaint:  Chief Complaint  Patient presents with  . Post-op Follow-up   Visit Diagnoses:  1. Acute pain of left shoulder     Plan: Julia Walter is a patient who is now about 2 months out from left shoulder labral repair.  She is reporting some tightness in the muscles.  I think she is okay for dry needling from a physical therapy perspective.  Taking baclofen ibuprofen and tramadol.  On exam she has external rotation on the left passively 40 degrees forward flexion 110 isolated glenohumeral abduction to 70.  Would like to put her on a Voltaren taper with 6-week return to decide then for or against manipulation under anesthesia.  Follow-Up Instructions: Return in about 6 weeks (around 02/15/2021).   Orders:  No orders of the defined types were placed in this encounter.  No orders of the defined types were placed in this encounter.   Imaging: No results found.  PMFS History: There are no problems to display for this patient.  History reviewed. No pertinent past medical history.  Family History  Problem Relation Age of Onset  . Cancer Mother   . Diabetes Mother   . Heart failure Father   . Diabetes Father     History reviewed. No pertinent surgical history. Social History   Occupational History  . Not on file  Tobacco Use  . Smoking status: Current Every Day Smoker    Packs/day: 1.00    Types: Cigarettes  . Smokeless tobacco: Never Used  Vaping Use  . Vaping Use: Never used  Substance and Sexual Activity  . Alcohol use: Never  . Drug use: Never  . Sexual activity: Yes

## 2021-01-06 ENCOUNTER — Other Ambulatory Visit: Payer: Self-pay | Admitting: Family Medicine

## 2021-01-06 MED ORDER — BACLOFEN 10 MG PO TABS: 5.0000 mg | ORAL_TABLET | Freq: Three times a day (TID) | ORAL | 6 refills | Status: DC | PRN

## 2021-01-06 MED ORDER — DICLOFENAC SODIUM 75 MG PO TBEC: 75.0000 mg | DELAYED_RELEASE_TABLET | Freq: Two times a day (BID) | ORAL | 3 refills | Status: DC | PRN

## 2021-01-06 MED ORDER — IBUPROFEN 600 MG PO TABS: 600.0000 mg | ORAL_TABLET | Freq: Three times a day (TID) | ORAL | 3 refills | Status: DC | PRN

## 2021-01-06 MED FILL — DICLOFENAC SOD EC 75 MG TAB: 75 | 30 days supply | Qty: 60 | Fill #0

## 2021-01-06 NOTE — Addendum Note (Signed)
Addended by: Lillia Carmel on: 01/06/2021 02:32 PM   Modules accepted: Orders

## 2021-01-09 ENCOUNTER — Encounter: Payer: 59 | Admitting: Rehabilitative and Restorative Service Providers"

## 2021-01-13 ENCOUNTER — Encounter: Payer: 59 | Admitting: Physical Therapy

## 2021-01-16 ENCOUNTER — Encounter: Payer: 59 | Admitting: Rehabilitative and Restorative Service Providers"

## 2021-01-19 ENCOUNTER — Encounter: Payer: 59 | Admitting: Physical Therapy

## 2021-01-20 ENCOUNTER — Other Ambulatory Visit: Payer: Self-pay | Admitting: Family Medicine

## 2021-01-20 MED FILL — IBUPROFEN 600 MG TABLET: 600 | 30 days supply | Qty: 90 | Fill #3

## 2021-01-20 MED FILL — BACLOFEN 10 MG TABS: 10 | 10 days supply | Qty: 30 | Fill #1

## 2021-01-20 MED FILL — traMADol HCL 50 MG TABS: 50 | 10 days supply | Qty: 30 | Fill #0

## 2021-01-23 ENCOUNTER — Other Ambulatory Visit: Payer: Self-pay

## 2021-01-23 ENCOUNTER — Ambulatory Visit (INDEPENDENT_AMBULATORY_CARE_PROVIDER_SITE_OTHER): Payer: 59 | Admitting: Rehabilitative and Restorative Service Providers"

## 2021-01-23 ENCOUNTER — Encounter: Payer: Self-pay | Admitting: Rehabilitative and Restorative Service Providers"

## 2021-01-23 DIAGNOSIS — M6281 Muscle weakness (generalized): Secondary | ICD-10-CM

## 2021-01-23 DIAGNOSIS — M25512 Pain in left shoulder: Secondary | ICD-10-CM

## 2021-01-23 DIAGNOSIS — R600 Localized edema: Secondary | ICD-10-CM | POA: Diagnosis not present
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
## 2021-01-23 NOTE — Patient Instructions (Signed)
Access Code: [email or web address] URL: [email or web address] Date: 01/23/2021 Prepared by: Margretta Ditty  Program Notes *   Exercises Shoulder Internal Rotation Reactive Isometrics - 2 x daily - 7 x weekly - 1 sets - 10 reps Shoulder External Rotation Reactive Isometrics - 2 x daily - 7 x weekly - 1 sets - 10 reps Isometric Shoulder Extension at Wall - 1 x daily - 7 x weekly - 1 sets - 5 reps - 5 seconds hold Standing Isometric Shoulder Internal Rotation at Doorway - 1 x daily - 7 x weekly - 1 sets - 5 reps - 5 seconds hold Seated Upper Trapezius Stretch - 2 x daily - 7 x weekly - 1 sets - 2 reps - 30 seconds hold Supine Shoulder Flexion Extension AAROM with [place] - 2 x daily - 7 x weekly - 1 sets - 10 reps Supine Single Arm Shoulder Protraction - 2 x daily - 7 x weekly - 1 sets - 10 reps Prone Scapular Retraction - 2 x daily - 7 x weekly - 1 sets - 10 reps Sleeper Stretch - 2 x daily - 7 x weekly - 1 sets - 3 reps - 20-30 seconds hold Standing Shoulder External Rotation Stretch in Doorway - 2 x daily - 7 x weekly - 1 sets - 3 reps - 30 seconds hold

## 2021-01-26 ENCOUNTER — Ambulatory Visit (INDEPENDENT_AMBULATORY_CARE_PROVIDER_SITE_OTHER): Payer: 59 | Admitting: Rehabilitative and Restorative Service Providers"

## 2021-01-26 ENCOUNTER — Other Ambulatory Visit: Payer: Self-pay

## 2021-01-26 ENCOUNTER — Encounter: Payer: Self-pay | Admitting: Rehabilitative and Restorative Service Providers"

## 2021-01-26 DIAGNOSIS — R600 Localized edema: Secondary | ICD-10-CM | POA: Diagnosis not present

## 2021-01-26 DIAGNOSIS — M6281 Muscle weakness (generalized): Secondary | ICD-10-CM

## 2021-01-26 DIAGNOSIS — M25512 Pain in left shoulder: Secondary | ICD-10-CM | POA: Diagnosis not present

## 2021-01-26 NOTE — Therapy (Signed)
[place], [place], [address] Phone: [phone]   Fax:  [phone]  Physical Therapy Treatment  Patient Details  Name: Julia Walter MRN: [id number] Date of Birth: [birth year] Referring Provider (PT): Meredith Pel, MD   Encounter Date: 01/26/2021   PT End of Session - 01/26/21 1253    Visit Number 10    Number of Visits 16    Date for PT Re-Evaluation 03/06/21    Activity Tolerance Patient tolerated treatment well    Behavior During Therapy [facility] for tasks assessed/performed           History reviewed. No pertinent past medical history.  History reviewed. No pertinent surgical history.  There were no vitals filed for this visit.   Subjective Assessment - 01/26/21 1057    Subjective The scapula loosened up, but the anterior shoulder is painful.    Patient Stated Goals "I would like to have a real life again."    Currently in Pain? Yes    Pain Score 4     Pain Location Shoulder    Pain Orientation Left    Pain Descriptors / Indicators Tightness;Sore    Pain Type Surgical pain    Pain Onset More than a month ago    Pain Frequency Intermittent    Aggravating Factors  was a 6-7/10, but improving today    Pain Relieving Factors ice              [facility] PT Assessment - 01/26/21 1100      Assessment   Medical Diagnosis Labral Repair L shoulder    Referring Provider (PT) Meredith Pel, MD    Onset Date/Surgical Date 10/24/20    Hand Dominance Right      AROM   Left Shoulder Flexion 122 Degrees                         [facility] Adult PT Treatment/Exercise - 01/26/21 1100      Exercises   Exercises Shoulder      Shoulder Exercises: Supine   Horizontal ABduction AAROM;Left;10 reps    Flexion PROM;AAROM;Left;10 reps    ABduction AAROM;Left;10 reps;PROM    Diagonals PROM;Left;10 reps      Shoulder Exercises: Sidelying   External Rotation  AROM;Strengthening;Left;10 reps    Flexion PROM;AAROM    ABduction PROM;AAROM      Shoulder Exercises: Standing   Internal Rotation AAROM;Left;10 reps    Flexion AAROM;AROM;Left;10 reps    ABduction AAROM;AROM;Left;10 reps    Retraction Strengthening;Both;12 reps    Retraction Limitations with pool noodle      Shoulder Exercises: ROM/Strengthening   UBE (Upper Arm Bike) L1 x 2 minutes forward/1 minute backwards    Rhythmic Stabilization, Supine L shoulder at 90 degrees flexion wiht rhythmic stabilization + end range isometrics working on muscle re-education    Other ROM/Strengthening Exercises pendulum in between activities to reduce pain      Shoulder Exercises: Isometric Strengthening   ABduction 3X5"      Manual Therapy   Manual Therapy Soft tissue mobilization;Joint mobilization    Manual therapy comments self mobilization for anterior shoulder, biceps and scapular musculature with tennis ball; skilled palpation to assess soft tissue response to DN    Joint Mobilization shoulder distraction grade II, superior>inferior glide with passive abduction grade I    Soft tissue mobilization STM parascapular musculature, cervical musculature due to guarding    Passive ROM supine  and SL            Trigger Point Dry Needling - 01/26/21 1147    Consent Given? Yes    Education Handout Provided Previously provided    Muscles Treated Upper Quadrant Deltoid;Biceps    Deltoid Response Palpable increased muscle length    Biceps Response Palpable increased muscle length                PT Education - 01/26/21 1253    Education Details HEP -- modified program; plan to progress strengthening next session    Person(s) Educated Patient    Methods Explanation;Demonstration;Handout    Comprehension Verbalized understanding;Returned demonstration            PT Short Term Goals - 12/23/20 1030      PT SHORT TERM GOAL #1   Title The patient will return demo HEP for AAROM to 90 degrees  and isometric strengthening.    Time 4    Period Weeks    Status Achieved    Target Date 12/23/20      PT SHORT TERM GOAL #2   Title The patient will report no pain at rest.    Baseline 5/10 baseline; Update 4/10 with tightness    Time 6    Period Weeks    Status Not Met    Target Date 12/23/20      PT SHORT TERM GOAL #3   Title The patient will tolerate 90 degrees AAROM without shoulder pain.    Time 6    Period Weeks    Status Achieved    Target Date 12/23/20             PT Long Term Goals - 01/23/21 1321      PT LONG TERM GOAL #1   Title The patient will be indep with HEP progression within parameters of protocol.    Time 6    Period Weeks    Status Revised    Target Date 03/06/21      PT LONG TERM GOAL #2   Title The patient will be able to return to daily household activities (folding laundry, light cleaning) with pain in L shoulder < or equal to 2/10.    Baseline pain = 2/10 today, gets some increase with household activities    Time 6    Period Weeks    Status Revised    Target Date 03/06/21      PT LONG TERM GOAL #3   Title The patient will reduce functional limitation from FOTO from 75% to < or equal to 39%.    Time 6    Period Weeks    Status Revised    Target Date 03/06/21      PT LONG TERM GOAL #4   Title The patient will reach to an overhead shelf without c/o L shoulder pain.    Time 6    Period Weeks    Status Revised    Target Date 03/06/21      PT LONG TERM GOAL #5   Title The patient will be able to reach behind her head for self care without L shoulder pain (as protocol allows).    Time 6    Period Weeks    Status Revised    Target Date 03/06/21                 Plan - 01/26/21 1300    Clinical Impression Statement The patient felt significant reduction in anterior tightness  at end of session.  She is working on gaining ROM >90 and PT had her perform standing activities in front of a mirror for cues when shoulder elevates. PT  to continue progressing emphasizing strengthening <90 (due to poor mechanics), and ROM >90.    Rehab Potential Good    PT Frequency 2x / week    PT Duration 6 weeks    PT Treatment/Interventions ADLs/Self Care Home Management;Patient/family education;Therapeutic exercise;Therapeutic activities;Taping;Manual techniques;Dry needling;Vasopneumatic Device;Electrical Stimulation;Cryotherapy;Moist Heat    PT Next Visit Plan Focus on gaining ROM and end range movement, STM to lengthen musculature, and functional use.    PT Home Exercise Plan [email or web address]    Consulted and Agree with Plan of Care Patient           Patient will benefit from skilled therapeutic intervention in order to improve the following deficits and impairments:  Pain,Decreased range of motion,Decreased strength,Postural dysfunction,Impaired flexibility,Hypomobility,Increased edema,Increased fascial restricitons  Visit Diagnosis: Acute pain of left shoulder  Muscle weakness (generalized)  Localized edema     Problem List There are no problems to display for this patient.   [place], [place] 01/26/2021, 1:05 PM  [facility] [place], [place], [address] Phone: [phone]   Fax:  [phone]  Name: Julia Walter MRN: [id number] Date of Birth: [birth year]

## 2021-01-26 NOTE — Patient Instructions (Signed)
Access Code: [email or web address] URL: [email or web address] Date: 01/26/2021 Prepared by: Margretta Ditty  Program Notes *   Exercises Shoulder Internal Rotation Reactive Isometrics - 2 x daily - 7 x weekly - 1 sets - 10 reps Shoulder External Rotation Reactive Isometrics - 2 x daily - 7 x weekly - 1 sets - 10 reps Isometric Shoulder Extension at Wall - 1 x daily - 7 x weekly - 1 sets - 5 reps - 5 seconds hold Standing Isometric Shoulder Internal Rotation at Doorway - 1 x daily - 7 x weekly - 1 sets - 5 reps - 5 seconds hold Seated Upper Trapezius Stretch - 2 x daily - 7 x weekly - 1 sets - 2 reps - 30 seconds hold Supine Shoulder Flexion Extension AAROM with Dowel - 2 x daily - 7 x weekly - 1 sets - 10 reps Sleeper Stretch - 2 x daily - 7 x weekly - 1 sets - 3 reps - 20-30 seconds hold Standing Shoulder External Rotation Stretch in Doorway - 2 x daily - 7 x weekly - 1 sets - 3 reps - 30 seconds hold Standing Shoulder Abduction AAROM with Dowel - 2 x daily - 7 x weekly - 1 sets - 10 reps Standing Anatomical Position with Scapular Retraction and Depression at Wall - 2 x daily - 7 x weekly - 1 sets - 10 reps Standing Shoulder Extension with Dowel - 2 x daily - 7 x weekly - 1 sets - 10 reps Standing Single Arm Shoulder Flexion Stretch on Wall - 2 x daily - 7 x weekly - 1 sets - 10 reps Chest Mobilization with Small Ball - 2 x daily - 7 x weekly - 1 sets - 10 reps Standing Bilateral Low Shoulder Row with Anchored Resistance - 2 x daily - 7 x weekly - 1 sets - 10 reps

## 2021-01-27 ENCOUNTER — Encounter: Payer: Self-pay | Admitting: Family Medicine

## 2021-02-01 ENCOUNTER — Other Ambulatory Visit: Payer: Self-pay

## 2021-02-01 ENCOUNTER — Encounter: Payer: Self-pay | Admitting: Physical Therapy

## 2021-02-01 ENCOUNTER — Ambulatory Visit (INDEPENDENT_AMBULATORY_CARE_PROVIDER_SITE_OTHER): Payer: 59 | Admitting: Physical Therapy

## 2021-02-01 DIAGNOSIS — M25512 Pain in left shoulder: Secondary | ICD-10-CM | POA: Diagnosis not present

## 2021-02-01 DIAGNOSIS — M6281 Muscle weakness (generalized): Secondary | ICD-10-CM

## 2021-02-01 DIAGNOSIS — R600 Localized edema: Secondary | ICD-10-CM

## 2021-02-01 NOTE — Therapy (Signed)
[place], [place], [address] Phone: [phone]   Fax:  [phone]  Physical Therapy Treatment  Patient Details  Name: Julia Walter MRN: [id number] Date of Birth: [birth year] Referring Provider (PT): Meredith Pel, MD   Encounter Date: 02/01/2021   PT End of Session - 02/01/21 1026    Visit Number 11    Number of Visits 16    Date for PT Re-Evaluation 03/06/21    PT Start Time 1021    PT Stop Time 1104    PT Time Calculation (min) 43 min    Activity Tolerance Patient tolerated treatment well    Behavior During Therapy [facility] for tasks assessed/performed           History reviewed. No pertinent past medical history.  History reviewed. No pertinent surgical history.  There were no vitals filed for this visit.   Subjective Assessment - 02/01/21 1027    Subjective Pt reports that the DN has helped loosen her shoulder up a lot.  She reports that her ROM has improved.  The rain has made her joints more achey. She was able to get cup out of cabinet yesterday for the first time.    Patient Stated Goals "I would like to have a real life again."    Currently in Pain? Yes    Pain Score 5     Pain Location Shoulder    Pain Orientation Left;Anterior    Pain Descriptors / Indicators Aching;Tightness    Pain Onset More than a month ago    Aggravating Factors  weather, horiz abdct    Pain Relieving Factors TENS              [facility] PT Assessment - 02/01/21 0001      Assessment   Medical Diagnosis Labral Repair L shoulder    Referring Provider (PT) Meredith Pel, MD    Onset Date/Surgical Date 10/24/20    Hand Dominance Right    Next MD Visit 02/13/21            [facility] Adult PT Treatment/Exercise - 02/01/21 0001      Shoulder Exercises: Supine   External Rotation Strengthening;Both;10 reps   3 sec pause, 2 sets   Flexion Both;AAROM;5 reps    Diagonals Limitations verbally  discussed AAROM with cane in LUE for HEP.      Shoulder Exercises: Seated   Row Both;5 reps    Theraband Level (Shoulder Row) Level 2 (Red)   shown for HEP.     Shoulder Exercises: Standing   Flexion Strengthening;Left;5 reps   to shoulder height shelf.   Shoulder Flexion Weight (lbs) 1      Shoulder Exercises: Pulleys   Flexion 2 minutes    Scaption 2 minutes      Shoulder Exercises: ROM/Strengthening   Nustep NuStep L3, arms only for warm up and ROM.      Shoulder Exercises: Stretch   Cross Chest Stretch Limitations 1 rep, unable to tolerate.    Internal Rotation Stretch 4 reps   30 sec, strap assist   Star Gazer Stretch 2 reps;60 seconds   AAROm from RUE, hand to top of head   Other Shoulder Stretches Lt bicep stretch holding counter x 20 sec x 3 reps;  Sitting with arms extended behind her, resting on hands (into wrist ext) x 30 sec x 3 reps      Manual Therapy   Soft tissue mobilization IASTM to  Lt biceps brachii, deltoid, brachialis, triceps brachii to decrease fascial restrictions                    PT Short Term Goals - 12/23/20 1030      PT SHORT TERM GOAL #1   Title The patient will return demo HEP for AAROM to 90 degrees and isometric strengthening.    Time 4    Period Weeks    Status Achieved    Target Date 12/23/20      PT SHORT TERM GOAL #2   Title The patient will report no pain at rest.    Baseline 5/10 baseline; Update 4/10 with tightness    Time 6    Period Weeks    Status Not Met    Target Date 12/23/20      PT SHORT TERM GOAL #3   Title The patient will tolerate 90 degrees AAROM without shoulder pain.    Time 6    Period Weeks    Status Achieved    Target Date 12/23/20             PT Long Term Goals - 01/23/21 1321      PT LONG TERM GOAL #1   Title The patient will be indep with HEP progression within parameters of protocol.    Time 6    Period Weeks    Status Revised    Target Date 03/06/21      PT LONG TERM GOAL #2    Title The patient will be able to return to daily household activities (folding laundry, light cleaning) with pain in L shoulder < or equal to 2/10.    Baseline pain = 2/10 today, gets some increase with household activities    Time 6    Period Weeks    Status Revised    Target Date 03/06/21      PT LONG TERM GOAL #3   Title The patient will reduce functional limitation from FOTO from 75% to < or equal to 39%.    Time 6    Period Weeks    Status Revised    Target Date 03/06/21      PT LONG TERM GOAL #4   Title The patient will reach to an overhead shelf without c/o L shoulder pain.    Time 6    Period Weeks    Status Revised    Target Date 03/06/21      PT LONG TERM GOAL #5   Title The patient will be able to reach behind her head for self care without L shoulder pain (as protocol allows).    Time 6    Period Weeks    Status Revised    Target Date 03/06/21                 Plan - 02/01/21 1241    Clinical Impression Statement Pt is now 14 wks s/p Lt shoulder labral repair.  She continues to have pain/guarding with AAROM of Lt shoulder flexion and abdct.  Pt tolerated standing Lt shoulder IR stretch with strap behind back well.  Pt reporting improvement in functional use of LUE at home.  She reporting reduction of pain and tightness in Lt shoulder at end of session.    Rehab Potential Good    PT Frequency 2x / week    PT Duration 6 weeks    PT Treatment/Interventions ADLs/Self Care Home Management;Patient/family education;Therapeutic exercise;Therapeutic activities;Taping;Manual techniques;Dry needling;Vasopneumatic Device;Electrical Stimulation;Cryotherapy;Moist Heat  PT Next Visit Plan Focus on gaining ROM and end range movement, STM to lengthen musculature, and functional use.    PT Home Exercise Plan [email or web address]    Consulted and Agree with Plan of Care Patient           Patient will benefit from skilled therapeutic intervention in order to improve the following  deficits and impairments:  Pain,Decreased range of motion,Decreased strength,Postural dysfunction,Impaired flexibility,Hypomobility,Increased edema,Increased fascial restricitons  Visit Diagnosis: Acute pain of left shoulder  Muscle weakness (generalized)  Localized edema     Problem List There are no problems to display for this patient.  Kerin Perna, PTA 02/01/21 1:01 PM  [facility] [place], [place], [address] Phone: [phone]   Fax:  [phone]  Name: Julia Walter MRN: [id number] Date of Birth: 1976-11-03

## 2021-02-01 NOTE — Patient Instructions (Signed)
Access Code: [email or web address] URL: [email or web address] Date: 02/01/2021 Prepared by: [facility] Notes *   Exercises Shoulder Internal Rotation Reactive Isometrics - 2 x daily - 7 x weekly - 1 sets - 10 reps Shoulder External Rotation Reactive Isometrics - 2 x daily - 7 x weekly - 1 sets - 10 reps Seated Upper Trapezius Stretch - 2 x daily - 7 x weekly - 1 sets - 2 reps - 30 seconds hold Supine Shoulder Flexion Extension AAROM with Dowel - 2 x daily - 7 x weekly - 1 sets - 10 reps Sleeper Stretch - 2 x daily - 7 x weekly - 1 sets - 3 reps - 20-30 seconds hold Standing Shoulder External Rotation Stretch in Doorway - 2 x daily - 7 x weekly - 1 sets - 3 reps - 30 seconds hold Standing Shoulder Abduction AAROM with Dowel - 2 x daily - 7 x weekly - 1 sets - 10 reps Standing Anatomical Position with Scapular Retraction and Depression at Wall - 2 x daily - 7 x weekly - 1 sets - 10 reps Standing Shoulder Extension with Dowel - 2 x daily - 7 x weekly - 1 sets - 10 reps Standing Single Arm Shoulder Flexion Stretch on Wall - 2 x daily - 7 x weekly - 1 sets - 10 reps Chest Mobilization with Small Ball - 2 x daily - 7 x weekly - 1 sets - 10 reps Standing Bilateral Low Shoulder Row with Anchored Resistance - 1 x daily - 3 x weekly - 2 sets - 10 reps Supine Shoulder External Rotation with Resistance - 1 x daily - 3 x weekly - 2 sets - 10 reps Standing Shoulder Internal Rotation Stretch with Towel - 1 x daily - 7 x weekly - 1 sets - 3 reps - 30 hold Supine Chest Stretch with Elbows Bent - 1 x daily - 7 x weekly - 1 sets - 2 reps - 30 hold Supine Shoulder Abduction AAROM with Dowel - 1 x daily - 7 x weekly - 1 sets - 5 reps - 15 hold

## 2021-02-03 ENCOUNTER — Other Ambulatory Visit: Payer: Self-pay

## 2021-02-03 ENCOUNTER — Encounter: Payer: Self-pay | Admitting: Rehabilitative and Restorative Service Providers"

## 2021-02-03 ENCOUNTER — Ambulatory Visit (INDEPENDENT_AMBULATORY_CARE_PROVIDER_SITE_OTHER): Payer: 59 | Admitting: Rehabilitative and Restorative Service Providers"

## 2021-02-03 DIAGNOSIS — M6281 Muscle weakness (generalized): Secondary | ICD-10-CM | POA: Diagnosis not present

## 2021-02-03 DIAGNOSIS — M25512 Pain in left shoulder: Secondary | ICD-10-CM | POA: Diagnosis not present

## 2021-02-03 DIAGNOSIS — R600 Localized edema: Secondary | ICD-10-CM | POA: Diagnosis not present

## 2021-02-03 NOTE — Therapy (Addendum)
[place], [place], [address] Phone: [phone]   Fax:  [phone]  Physical Therapy Treatment  Patient Details  Name: Julia Walter MRN: [id number] Date of Birth: [birth year] Referring Provider (PT): Meredith Pel, MD   Encounter Date: 02/03/2021   PT End of Session - 02/03/21 1355    Visit Number 12    Number of Visits 16    Date for PT Re-Evaluation 03/06/21    PT Start Time 1018    PT Stop Time 1120    PT Time Calculation (min) 62 min    Activity Tolerance Patient tolerated treatment well    Behavior During Therapy [facility] for tasks assessed/performed           History reviewed. No pertinent past medical history.  History reviewed. No pertinent surgical history.  There were no vitals filed for this visit.   Subjective Assessment - 02/03/21 1022    Subjective The patient is using the L UE in daily activities and is able to do more.    Patient Stated Goals "I would like to have a real life again."    Currently in Pain? Yes    Pain Score 3     Pain Location Shoulder    Pain Orientation Left;Anterior    Pain Descriptors / Indicators Aching;Tightness    Pain Type Surgical pain    Pain Onset More than a month ago    Pain Frequency Intermittent    Aggravating Factors  weather, horiz abduction    Pain Relieving Factors TENS, dry needling              [facility] PT Assessment - 02/03/21 1024      Assessment   Medical Diagnosis Labral Repair L shoulder    Referring Provider (PT) Meredith Pel, MD    Onset Date/Surgical Date 10/24/20      AROM   Left Shoulder Flexion 135 Degrees (P)       PROM   Left Shoulder Flexion 140 Degrees (P)                          [facility] Adult PT Treatment/Exercise - 02/03/21 1027      Exercises   Exercises Shoulder      Shoulder Exercises: Supine   Horizontal ABduction PROM;AROM    External Rotation PROM;AROM;10 reps     Flexion AROM;PROM    Diagonals PROM;AAROM;Left      Shoulder Exercises: ROM/Strengthening   UBE (Upper Arm Bike) L2 x 2 minutes forward/1 backward    Rhythmic Stabilization, Supine L shoulder at 90 degrees flexion wiht rhythmic stabilization + end range isometrics working on muscle re-education      Manual Therapy   Manual Therapy Joint mobilization;Soft tissue mobilization;Myofascial release;Manual Traction    Manual therapy comments to reduce muscle guarding and pain; skilled palpation to assess soft tissue response to DN    Joint Mobilization shoulder distraction, AC AP joint mobs grade II-III, GH inferior glide grade II    Soft tissue mobilization pecs, lat, teres, parascapular, cervical traction and suboccipital release    Myofascial Release pecs, suboccipitals    Passive ROM overpressure for flexion, ER, and abduction    Manual Traction to tolerance                    PT Short Term Goals - 12/23/20 1030      PT SHORT TERM GOAL #1  Title The patient will return demo HEP for AAROM to 90 degrees and isometric strengthening.    Time 4    Period Weeks    Status Achieved    Target Date 12/23/20      PT SHORT TERM GOAL #2   Title The patient will report no pain at rest.    Baseline 5/10 baseline; Update 4/10 with tightness    Time 6    Period Weeks    Status Not Met    Target Date 12/23/20      PT SHORT TERM GOAL #3   Title The patient will tolerate 90 degrees AAROM without shoulder pain.    Time 6    Period Weeks    Status Achieved    Target Date 12/23/20             PT Long Term Goals - 01/23/21 1321      PT LONG TERM GOAL #1   Title The patient will be indep with HEP progression within parameters of protocol.    Time 6    Period Weeks    Status Revised    Target Date 03/06/21      PT LONG TERM GOAL #2   Title The patient will be able to return to daily household activities (folding laundry, light cleaning) with pain in L shoulder < or equal to  2/10.    Baseline pain = 2/10 today, gets some increase with household activities    Time 6    Period Weeks    Status Revised    Target Date 03/06/21      PT LONG TERM GOAL #3   Title The patient will reduce functional limitation from FOTO from 75% to < or equal to 39%.    Time 6    Period Weeks    Status Revised    Target Date 03/06/21      PT LONG TERM GOAL #4   Title The patient will reach to an overhead shelf without c/o L shoulder pain.    Time 6    Period Weeks    Status Revised    Target Date 03/06/21      PT LONG TERM GOAL #5   Title The patient will be able to reach behind her head for self care without L shoulder pain (as protocol allows).    Time 6    Period Weeks    Status Revised    Target Date 03/06/21             02/03/21 1356  Plan  Clinical Impression Statement The patient has made progress with A/ROM and responds well to STM and DN.  PT is continuing to progress strengthening, soft tissue work and range of motion.  PT Treatment/Interventions ADLs/Self Care Home Management;Patient/family education;Therapeutic exercise;Therapeutic activities;Taping;Manual techniques;Dry needling;Vasopneumatic Device;Electrical Stimulation;Cryotherapy;Moist Heat  PT Next Visit Plan Focus on gaining ROM and end range movement, STM to lengthen musculature, and functional use.  PT Home Exercise Plan [email or web address]  Consulted and Agree with Plan of Care Patient         Patient will benefit from skilled therapeutic intervention in order to improve the following deficits and impairments:     Visit Diagnosis: Acute pain of left shoulder  Muscle weakness (generalized)  Localized edema     Problem List There are no problems to display for this patient.   [place], [place] 02/03/2021, 3:07 PM  [facility] [place], [place], [address]  Phone: [phone]   Fax:  [phone]  Name: Julia Walter MRN: [id number] Date of Birth: 1976-01-30

## 2021-02-06 ENCOUNTER — Other Ambulatory Visit: Payer: Self-pay | Admitting: Family Medicine

## 2021-02-06 MED FILL — DICLOFENAC SOD EC 75 MG TAB: 75 | 30 days supply | Qty: 60 | Fill #1

## 2021-02-06 MED FILL — traMADol HCL 50 MG TABS: 50 | 10 days supply | Qty: 30 | Fill #0

## 2021-02-06 MED FILL — BACLOFEN 10 MG TABS: 10 | 10 days supply | Qty: 30 | Fill #2

## 2021-02-07 ENCOUNTER — Ambulatory Visit (INDEPENDENT_AMBULATORY_CARE_PROVIDER_SITE_OTHER): Payer: 59 | Admitting: Physical Therapy

## 2021-02-07 ENCOUNTER — Encounter: Payer: Self-pay | Admitting: Physical Therapy

## 2021-02-07 ENCOUNTER — Other Ambulatory Visit: Payer: Self-pay

## 2021-02-07 DIAGNOSIS — M25512 Pain in left shoulder: Secondary | ICD-10-CM

## 2021-02-07 DIAGNOSIS — M6281 Muscle weakness (generalized): Secondary | ICD-10-CM

## 2021-02-07 NOTE — Therapy (Signed)
[place], [place], [address] Phone: [phone]   Fax:  [phone]  Physical Therapy Treatment  Patient Details  Name: Julia Walter MRN: [id number] Date of Birth: 1976-04-01 Referring Provider (PT): Meredith Pel, MD   Encounter Date: 02/07/2021   PT End of Session - 02/07/21 1147    Visit Number 13    Number of Visits 16    Date for PT Re-Evaluation 03/06/21    PT Start Time 0109    PT Stop Time 1228    PT Time Calculation (min) 41 min    Activity Tolerance Patient tolerated treatment well    Behavior During Therapy [facility] for tasks assessed/performed           History reviewed. No pertinent past medical history.  History reviewed. No pertinent surgical history.  There were no vitals filed for this visit.   Subjective Assessment - 02/07/21 1150    Subjective Pt reports that the DN has helped her motion and she is having less pain at rest.    Patient Stated Goals "I would like to have a real life again."    Currently in Pain? Yes    Pain Score 1    up to 3-4/10 with motion   Pain Location Shoulder    Pain Orientation Left;Anterior    Pain Descriptors / Indicators Aching    Aggravating Factors  weather, horiz abdct    Pain Relieving Factors TENS, DN              [facility] PT Assessment - 02/07/21 0001      Assessment   Medical Diagnosis Labral Repair L shoulder    Referring Provider (PT) Meredith Pel, MD    Onset Date/Surgical Date 10/24/20    Next MD Visit 02/13/21             [facility] Adult PT Treatment/Exercise - 02/07/21 0001      Shoulder Exercises: Supine   Protraction AROM;Left;10 reps   at various heights.   Other Supine Exercises LUE clocks to available range.    Other Supine Exercises D1/D2 patterns with light resistance from therapist x 5-8 reps each direction.      Shoulder Exercises: Sidelying   ABduction AROM;Left;10 reps   to 90deg, with cues to  avoid scap hiking.     Shoulder Exercises: ROM/Strengthening   UBE (Upper Arm Bike) L2 x 2 minutes forward/1 backward      Shoulder Exercises: Stretch   Internal Rotation Stretch 3 reps   30 sec, strap assist   Star Gazer Stretch 2 reps;30 seconds   back of palm to forehead   Other Shoulder Stretches Rt sidelying with hand on hip for shoulder ext/ER stretch x 60, 3 reps    Other Shoulder Stretches trial of childs pose with arms forward; unable to tolerate Lt shoulder >90 deg flexion.      Manual Therapy   Soft tissue mobilization IASTM to Lt biceps brachii, brachialis, long head of triceps brachii, posterior deltoid to decrease fascial restriction and improve ROM.    Passive ROM overpressure for Lt shoulder flexion, ER, and abduction    Manual Traction Lt to tolerance to reduce guarding.                    PT Short Term Goals - 12/23/20 1030      PT SHORT TERM GOAL #1   Title The patient will return demo HEP for [employer]  to 90 degrees and isometric strengthening.    Time 4    Period Weeks    Status Achieved    Target Date 12/23/20      PT SHORT TERM GOAL #2   Title The patient will report no pain at rest.    Baseline 5/10 baseline; Update 4/10 with tightness    Time 6    Period Weeks    Status Not Met    Target Date 12/23/20      PT SHORT TERM GOAL #3   Title The patient will tolerate 90 degrees AAROM without shoulder pain.    Time 6    Period Weeks    Status Achieved    Target Date 12/23/20             PT Long Term Goals - 01/23/21 1321      PT LONG TERM GOAL #1   Title The patient will be indep with HEP progression within parameters of protocol.    Time 6    Period Weeks    Status Revised    Target Date 03/06/21      PT LONG TERM GOAL #2   Title The patient will be able to return to daily household activities (folding laundry, light cleaning) with pain in L shoulder < or equal to 2/10.    Baseline pain = 2/10 today, gets some increase with household  activities    Time 6    Period Weeks    Status Revised    Target Date 03/06/21      PT LONG TERM GOAL #3   Title The patient will reduce functional limitation from FOTO from 75% to < or equal to 39%.    Time 6    Period Weeks    Status Revised    Target Date 03/06/21      PT LONG TERM GOAL #4   Title The patient will reach to an overhead shelf without c/o L shoulder pain.    Time 6    Period Weeks    Status Revised    Target Date 03/06/21      PT LONG TERM GOAL #5   Title The patient will be able to reach behind her head for self care without L shoulder pain (as protocol allows).    Time 6    Period Weeks    Status Revised    Target Date 03/06/21                 Plan - 02/07/21 1246    Clinical Impression Statement Pt reports positive response to DN.  Pt continues with limited Lt shoulder ROM in IR/ER and abdct/flex above 90 deg.  Open book with Lt rotation initially very limited, but with increased time and repetition.  Pt making gradual gains of strength and range.    PT Frequency 2x / week    PT Duration 6 weeks    PT Treatment/Interventions ADLs/Self Care Home Management;Patient/family education;Therapeutic exercise;Therapeutic activities;Taping;Manual techniques;Dry needling;Vasopneumatic Device;Electrical Stimulation;Cryotherapy;Moist Heat    PT Next Visit Plan Focus on gaining ROM and end range movement, STM to lengthen musculature, and functional use.    PT Home Exercise Plan [email or web address]    Consulted and Agree with Plan of Care Patient           Patient will benefit from skilled therapeutic intervention in order to improve the following deficits and impairments:  Pain,Decreased range of motion,Decreased strength,Postural dysfunction,Impaired flexibility,Hypomobility,Increased edema,Increased fascial restricitons  Visit Diagnosis: Acute pain  of left shoulder  Muscle weakness (generalized)     Problem List There are no problems to display for this  patient.  Kerin Perna, PTA 02/07/21 12:54 PM  [place], [place], [address] Phone: [phone]   Fax:  [phone]  Name: Julia Walter MRN: [id number] Date of Birth: 1976/08/25

## 2021-02-09 ENCOUNTER — Ambulatory Visit (INDEPENDENT_AMBULATORY_CARE_PROVIDER_SITE_OTHER): Payer: 59 | Admitting: Physical Therapy

## 2021-02-09 ENCOUNTER — Encounter: Payer: Self-pay | Admitting: Physical Therapy

## 2021-02-09 ENCOUNTER — Other Ambulatory Visit: Payer: Self-pay

## 2021-02-09 DIAGNOSIS — M25512 Pain in left shoulder: Secondary | ICD-10-CM | POA: Diagnosis not present

## 2021-02-09 DIAGNOSIS — M6281 Muscle weakness (generalized): Secondary | ICD-10-CM

## 2021-02-09 NOTE — Therapy (Signed)
[place], [place], [address] Phone: [phone]   Fax:  [phone]  Physical Therapy Treatment  Patient Details  Name: Julia Walter MRN: [id number] Date of Birth: [birth year] Referring Provider (PT): Meredith Pel, MD   Encounter Date: 02/09/2021   PT End of Session - 02/09/21 0936    Visit Number 14    Number of Visits 16    Date for PT Re-Evaluation 03/06/21    PT Start Time 0933    PT Stop Time 1015    PT Time Calculation (min) 42 min    Activity Tolerance Patient tolerated treatment well    Behavior During Therapy [facility] for tasks assessed/performed           History reviewed. No pertinent past medical history.  History reviewed. No pertinent surgical history.  There were no vitals filed for this visit.   Subjective Assessment - 02/09/21 0938    Subjective Julia Walter reports she had some "good" soreness the day after last session.  "I'm beginning to tighten up again".  Pt returns to surgeon on Monday.    Patient Stated Goals "I would like to have a real life again."    Currently in Pain? Yes    Pain Score 3     Pain Location Shoulder    Pain Orientation Left    Pain Descriptors / Indicators Aching;Dull    Aggravating Factors  weather, horiz abdct    Pain Relieving Factors TENS, DN              [facility] PT Assessment - 02/09/21 0001      Assessment   Medical Diagnosis Labral Repair L shoulder    Referring Provider (PT) Meredith Pel, MD    Onset Date/Surgical Date 10/24/20    Next MD Visit 02/13/21      AROM   Left Shoulder Extension 40 Degrees    Left Shoulder Flexion 124 Degrees    Left Shoulder ABduction 104 Degrees    Left Shoulder Internal Rotation --   thumb to L5   Left Shoulder External Rotation 45 Degrees   in scapular plane, ~50 deg     PROM   Right/Left Shoulder Left    Left Shoulder Flexion 130 Degrees   supine   Left Shoulder External Rotation 54  Degrees   scapular plane             [facility] Adult PT Treatment/Exercise - 02/09/21 0001      Shoulder Exercises: Supine   Flexion AAROM;Left;5 reps    Diagonals AAROM;Left;5 reps      Shoulder Exercises: Sidelying   External Rotation Strengthening;Left;10 reps    External Rotation Weight (lbs) 1    Other Sidelying Exercises sleeper stretch IR/ ER x 30 sec x 2 reps each direction.    Other Sidelying Exercises open book with Lt thoracic rotation with 15 sec hold x 3      Shoulder Exercises: Standing   Other Standing Exercises LUE in flexion: wall ladder 5x.  LUE - reaching overhead shelf x 4 reps with cues to avoid compensating with back.      Shoulder Exercises: ROM/Strengthening   Nustep L5: arms/legs x 4 min for warm up.      Shoulder Exercises: Stretch   Other Shoulder Stretches Rt sidelying with hand on hip for shoulder ext/ER stretch x 60, 3 reps    Other Shoulder Stretches shoulder ext stretch      Modalities  Modalities Moist Heat      Moist Heat Therapy   Number Minutes Moist Heat --   during sidelying stretch   Moist Heat Location Shoulder   Lt     Manual Therapy   Passive ROM overpressure for Lt shoulder flexion, ER, and abduction                    PT Short Term Goals - 12/23/20 1030      PT SHORT TERM GOAL #1   Title The patient will return demo HEP for AAROM to 90 degrees and isometric strengthening.    Time 4    Period Weeks    Status Achieved    Target Date 12/23/20      PT SHORT TERM GOAL #2   Title The patient will report no pain at rest.    Baseline 5/10 baseline; Update 4/10 with tightness    Time 6    Period Weeks    Status Not Met    Target Date 12/23/20      PT SHORT TERM GOAL #3   Title The patient will tolerate 90 degrees AAROM without shoulder pain.    Time 6    Period Weeks    Status Achieved    Target Date 12/23/20             PT Long Term Goals - 02/09/21 0940      PT LONG TERM GOAL #1   Title The patient  will be indep with HEP progression within parameters of protocol.    Time 6    Period Weeks    Status On-going      PT LONG TERM GOAL #2   Title The patient will be able to return to daily household activities (folding laundry, light cleaning) with pain in L shoulder < or equal to 2/10.    Baseline pain 2/10 with household activities. Not folding laundry yet.    Time 6    Period Weeks    Status Partially Met      PT LONG TERM GOAL #3   Title The patient will reduce functional limitation from FOTO from 75% to < or equal to 39%.    Time 6    Period Weeks    Status On-going      PT LONG TERM GOAL #4   Title The patient will reach to an overhead shelf without c/o L shoulder pain.    Time 6    Period Weeks    Status Partially Met      PT LONG TERM GOAL #5   Title The patient will be able to reach behind her head for self care without L shoulder pain (as protocol allows).    Time 6    Period Weeks    Status Partially Met                 Plan - 02/09/21 1018    Clinical Impression Statement Pt has had positive (but short term) response to trigger point dry needling in previous sessions.  Lt shoulder ROM continues to be limited in flexion/ abdct above 90 deg, and ER to ~45-50 deg.  Pt tolerated session with pain < 4-5/10.  She has partially met her goals at this time.    PT Frequency 2x / week    PT Duration 6 weeks    PT Treatment/Interventions ADLs/Self Care Home Management;Patient/family education;Therapeutic exercise;Therapeutic activities;Taping;Manual techniques;Dry needling;Vasopneumatic Device;Electrical Stimulation;Cryotherapy;Moist Heat    PT Next Visit  Plan pt returns to MD; await further advisement.    PT Home Exercise Plan [id number]    Consulted and Agree with Plan of Care Patient           Patient will benefit from skilled therapeutic intervention in order to improve the following deficits and impairments:  Pain,Decreased range of motion,Decreased  strength,Postural dysfunction,Impaired flexibility,Hypomobility,Increased edema,Increased fascial restricitons  Visit Diagnosis: Acute pain of left shoulder  Muscle weakness (generalized)     Problem List There are no problems to display for this patient.  Kerin Perna, PTA 02/09/21 10:24 AM  [place], [place], [address] Phone: [phone]   Fax:  [phone]  Name: Julia Walter MRN: [id number] Date of Birth: 08-Mar-1976

## 2021-02-13 ENCOUNTER — Ambulatory Visit: Payer: Self-pay

## 2021-02-13 ENCOUNTER — Encounter: Payer: Self-pay | Admitting: Rehabilitative and Restorative Service Providers"

## 2021-02-13 ENCOUNTER — Other Ambulatory Visit: Payer: Self-pay

## 2021-02-13 ENCOUNTER — Encounter: Payer: Self-pay | Admitting: Orthopedic Surgery

## 2021-02-13 ENCOUNTER — Ambulatory Visit (INDEPENDENT_AMBULATORY_CARE_PROVIDER_SITE_OTHER): Payer: 59 | Admitting: Orthopedic Surgery

## 2021-02-13 ENCOUNTER — Encounter: Payer: 59 | Admitting: Physical Therapy

## 2021-02-13 DIAGNOSIS — M25512 Pain in left shoulder: Secondary | ICD-10-CM | POA: Diagnosis not present

## 2021-02-13 DIAGNOSIS — M7502 Adhesive capsulitis of left shoulder: Secondary | ICD-10-CM

## 2021-02-13 NOTE — Progress Notes (Signed)
Office Visit Note   Patient: Julia Walter           Date of Birth: Jun 29, 1976           MRN: [id number] Visit Date: 02/13/2021 Requested by: No referring provider defined for this encounter. PCP: Patient, No Pcp Per  Subjective: Chief Complaint  Patient presents with  . Left Shoulder - Follow-up    HPI: Julia Walter is a 45 y.o. female who presents to the office complaining of left shoulder pain and stiffness.  Patient is 3.5 months out from left shoulder labral repair.  She is doing well and feels she is continuing to make progress.  She is going to physical therapy 2 times a week where she has felt the dry needling is pretty helpful.  She feels she has turned a corner in the last couple weeks.  Pain is improving to the point where she can do a lot more household tasks but she still cannot sleep on her left side that she can lay on it for a brief amount of time.  She is taking ibuprofen, Ultram, baclofen..                ROS: All systems reviewed are negative as they relate to the chief complaint within the history of present illness.  Patient denies fevers or chills.  Assessment & Plan: Visit Diagnoses:  1. Acute pain of left shoulder   2. Adhesive capsulitis of left shoulder     Plan: Patient is a 45 year old female presents complaining of left shoulder stiffness.  She has been dealing with left shoulder adhesive capsulitis.  No significant improvement of passive range of motion compared with last exam 6 weeks ago though she does feel subjectively like she is improving.  Discussed options available to patient.  She would like to hold off on manipulation under anesthesia for now.  I think it is reasonable to try a glenohumeral cortisone injection with follow-up in 4 weeks.  If no significant improvement then consider manipulation under anesthesia at that point.  Patient agreed with this plan.  Follow-Up Instructions: No follow-ups on file.   Orders:  Orders Placed This  Encounter  Procedures  . US Guided Needle Placement - No Linked Charges   No orders of the defined types were placed in this encounter.     Procedures: No procedures performed   Clinical Data: No additional findings.  Objective: Vital Signs: There were no vitals taken for this visit.  Physical Exam:  Constitutional: Patient appears well-developed HEENT:  Head: Normocephalic Eyes:EOM are normal Neck: Normal range of motion Cardiovascular: Normal rate Pulmonary/chest: Effort normal Neurologic: Patient is alert Skin: Skin is warm Psychiatric: Patient has normal mood and affect  Ortho Exam: Ortho exam demonstrates left shoulder with 45 degrees external rotation, 70 degrees abduction, 120 degrees forward flexion.  Decreased internal rotation compared with contralateral side.  No grinding or crepitus noted.  Excellent rotator cuff strength of supraspinatus, infraspinatus, subscapularis.  Incisions well-healed from prior surgery.  Specialty Comments:  No specialty comments available.  Imaging: No results found.   PMFS History: There are no problems to display for this patient.  No past medical history on file.  Family History  Problem Relation Age of Onset  . Cancer Mother   . Diabetes Mother   . Heart failure Father   . Diabetes Father     No past surgical history on file. Social History   Occupational History  . Not on  file  Tobacco Use  . Smoking status: Current Every Day Smoker    Packs/day: 1.00    Types: Cigarettes  . Smokeless tobacco: Never Used  Vaping Use  . Vaping Use: Never used  Substance and Sexual Activity  . Alcohol use: Never  . Drug use: Never  . Sexual activity: Yes

## 2021-02-13 NOTE — Progress Notes (Signed)
Subjective: Patient is here for ultrasound-guided intra-articular left glenohumeral injection.  Stiffness s/p labrum repair.  Objective:  Decreased overhead reach.  Procedure: Ultrasound guided injection is preferred based studies that show increased duration, increased effect, greater accuracy, decreased procedural pain, increased response rate, and decreased cost with ultrasound guided versus blind injection.   Verbal informed consent obtained.  Time-out conducted.  Noted no overlying erythema, induration, or other signs of local infection. Ultrasound-guided left glenohumeral injection: After sterile prep with Betadine, injected 4 cc 0.25% bupivocaine without epinephrine and 6 mg betamethasone using a 22-gauge spinal needle, passing the needle from posterior approach into the glenohumeral joint.  Injectate seen filling joint capsule.

## 2021-02-15 ENCOUNTER — Other Ambulatory Visit: Payer: Self-pay

## 2021-02-15 ENCOUNTER — Ambulatory Visit (INDEPENDENT_AMBULATORY_CARE_PROVIDER_SITE_OTHER): Payer: 59 | Admitting: Rehabilitative and Restorative Service Providers"

## 2021-02-15 ENCOUNTER — Encounter: Payer: Self-pay | Admitting: Rehabilitative and Restorative Service Providers"

## 2021-02-15 DIAGNOSIS — M25512 Pain in left shoulder: Secondary | ICD-10-CM

## 2021-02-15 DIAGNOSIS — R600 Localized edema: Secondary | ICD-10-CM | POA: Diagnosis not present

## 2021-02-15 DIAGNOSIS — M6281 Muscle weakness (generalized): Secondary | ICD-10-CM | POA: Diagnosis not present

## 2021-02-15 NOTE — Therapy (Signed)
[place], [place], [address] Phone: [phone]   Fax:  [phone]  Physical Therapy Treatment  Patient Details  Name: Julia Walter MRN: [id number] Date of Birth: September 02, 1976 Referring Provider (PT): Meredith Pel, MD   Encounter Date: 02/15/2021   PT End of Session - 02/15/21 1114    Visit Number 15    Number of Visits 22    Date for PT Re-Evaluation 03/06/21    PT Start Time 1105    PT Stop Time 1150    PT Time Calculation (min) 45 min    Activity Tolerance Patient tolerated treatment well    Behavior During Therapy [facility] for tasks assessed/performed           History reviewed. No pertinent past medical history.  History reviewed. No pertinent surgical history.  There were no vitals filed for this visit.   Subjective Assessment - 02/15/21 1106    Subjective The patient saw MD on Monday and had a steroid injection.  "I'm starting to finally feel like I can do some stuff again." She is worried that manipulation under anesthesia would be a step back in her progress.  She is going ot f/u with MD in 4 weeks and if no progress will get the manipulation.    Patient Stated Goals "I would like to have a real life again."    Currently in Pain? Yes    Pain Score 2    sore from the injection Monday   Pain Location Shoulder    Pain Orientation Left    Pain Descriptors / Indicators Aching;Sore    Pain Type Surgical pain;Chronic pain    Pain Onset More than a month ago    Pain Frequency Intermittent    Aggravating Factors  weather, horizontal abduction    Pain Relieving Factors TENS, DN              [facility] PT Assessment - 02/15/21 1115      Assessment   Medical Diagnosis Labral Repair L shoulder    Referring Provider (PT) Meredith Pel, MD    Onset Date/Surgical Date 10/24/20    Hand Dominance Right      PROM   Left Shoulder Flexion 145 Degrees   supine   Left Shoulder  Internal Rotation 60 Degrees    Left Shoulder External Rotation 48 Degrees                         [facility] Adult PT Treatment/Exercise - 02/15/21 1115      Exercises   Exercises Shoulder      Shoulder Exercises: Supine   Horizontal ABduction PROM;AROM;Left;10 reps    Horizontal ABduction Limitations with manual resistance nearing end range for muscle re-ed    External Rotation PROM;Left;10 reps    External Rotation Weight (lbs) 2    External Rotation Limitations also holding a weight to use gravity to increase stretch at end range; end range isometrics    Internal Rotation PROM;AAROM;Left    Flexion PROM;AROM;Left;10 reps    Flexion Limitations end range flexion with isometric holds (2 finger resistance)    ABduction PROM;Left;10 reps    ABduction Limitations with inferior glide when abducted to 90    Diagonals AAROM;PROM;Left    Diagonals Limitations PT assist for AAROM in diagonal pattern and then end range overpressure      Shoulder Exercises: Prone   Retraction Strengthening;Left;10 reps  Other Prone Exercises prone sliding L UE forward into flexion    Other Prone Exercises prone overpressure for pec release      Shoulder Exercises: Sidelying   Internal Rotation PROM;Left;5 reps;AROM    Flexion AAROM;Left;10 reps    ABduction PROM;Left    ABduction Limitations with pin and stretch scapula    Other Sidelying Exercises sleeper stretch IR/ ER x 30 sec x 2 reps each direction.      Shoulder Exercises: Standing   Flexion AROM;AAROM;Left;10 reps    Flexion Limitations at wall leaning for stretch with cues to avoid thoracic extension    ABduction Strengthening;Both;15 reps    Shoulder ABduction Weight (lbs) 1    ABduction Limitations cues in mirror to avoid shoulder hike    Other Standing Exercises wall plank with push up x 10 reps with cues to avoid thoracic extension      Shoulder Exercises: ROM/Strengthening   UBE (Upper Arm Bike) L3 x 2 minutes forward/1  minute backwards      Shoulder Exercises: Stretch   Internal Rotation Stretch 3 reps      Manual Therapy   Manual Therapy Joint mobilization;Soft tissue mobilization    Manual therapy comments to reduce muscle guarding, improve joint mobility, improve ROM, decrease pain    Joint Mobilization sup>inferior glide grade II-III while abducting to improve mobility with movement, GH AP mobs grade III, shoulder distraction between stretching for relaxation    Soft tissue mobilization STM and pin and stretch for parascapular mobility and deltoid, biceps and pec mobilization    Myofascial Release pec    Passive ROM overpressure in flexion, abduction, ER for end range movement using pin and stretch to release parascapular musculature                    PT Short Term Goals - 12/23/20 1030      PT SHORT TERM GOAL #1   Title The patient will return demo HEP for AAROM to 90 degrees and isometric strengthening.    Time 4    Period Weeks    Status Achieved    Target Date 12/23/20      PT SHORT TERM GOAL #2   Title The patient will report no pain at rest.    Baseline 5/10 baseline; Update 4/10 with tightness    Time 6    Period Weeks    Status Not Met    Target Date 12/23/20      PT SHORT TERM GOAL #3   Title The patient will tolerate 90 degrees AAROM without shoulder pain.    Time 6    Period Weeks    Status Achieved    Target Date 12/23/20             PT Long Term Goals - 02/15/21 1220      PT LONG TERM GOAL #1   Title The patient will be indep with HEP progression within parameters of protocol.    Time 6    Period Weeks    Status On-going      PT LONG TERM GOAL #2   Title The patient will be able to return to daily household activities (folding laundry, light cleaning) with pain in L shoulder < or equal to 2/10.    Baseline pain 2/10 with household activities. Not folding laundry yet.    Time 6    Period Weeks    Status Partially Met      PT LONG TERM GOAL #3  Title The patient will reduce functional limitation from FOTO from 75% to < or equal to 39%.    Time 6    Period Weeks    Status On-going      PT LONG TERM GOAL #4   Title The patient will reach to an overhead shelf without c/o L shoulder pain.    Time 6    Period Weeks    Status Achieved      PT LONG TERM GOAL #5   Title The patient will be able to reach behind her head for self care without L shoulder pain (as protocol allows).    Time 6    Period Weeks    Status Partially Met                 Plan - 02/15/21 1222    Clinical Impression Statement The patient continues with joint hypomobility, capsular tightness, and soft tissue restrictions limiting left shoulder ROM.  PT progressing manual and ther ex to patient toleance.  Plan to continue working to [place] due 3/28 anticipating renewal and continuing to work ROM until f/u with MD in 4 weeks.    PT Treatment/Interventions ADLs/Self Care Home Management;Patient/family education;Therapeutic exercise;Therapeutic activities;Taping;Manual techniques;Dry needling;Vasopneumatic Device;Electrical Stimulation;Cryotherapy;Moist Heat    PT Next Visit Plan pt returns to MD; await further advisement.    PT Home Exercise Plan [id number]    Consulted and Agree with Plan of Care Patient           Patient will benefit from skilled therapeutic intervention in order to improve the following deficits and impairments:     Visit Diagnosis: Acute pain of left shoulder  Muscle weakness (generalized)  Localized edema     Problem List There are no problems to display for this patient.   [place], [place] 02/15/2021, 12:24 PM  [facility] [place], [place], [address] Phone: [phone]   Fax:  [phone]  Name: Julia Walter MRN: [id number] Date of Birth: [birth year]

## 2021-02-20 ENCOUNTER — Ambulatory Visit (INDEPENDENT_AMBULATORY_CARE_PROVIDER_SITE_OTHER): Payer: 59 | Admitting: Rehabilitative and Restorative Service Providers"

## 2021-02-20 ENCOUNTER — Encounter: Payer: Self-pay | Admitting: Rehabilitative and Restorative Service Providers"

## 2021-02-20 ENCOUNTER — Other Ambulatory Visit: Payer: Self-pay

## 2021-02-20 DIAGNOSIS — R600 Localized edema: Secondary | ICD-10-CM

## 2021-02-20 DIAGNOSIS — M6281 Muscle weakness (generalized): Secondary | ICD-10-CM

## 2021-02-20 DIAGNOSIS — M25512 Pain in left shoulder: Secondary | ICD-10-CM

## 2021-02-20 NOTE — Therapy (Signed)
[place], [place], [address] Phone: [phone]   Fax:  [phone]  Physical Therapy Treatment  Patient Details  Name: Julia Walter MRN: [id number] Date of Birth: 12/09/1976 Referring Provider (PT): Meredith Pel, MD   Encounter Date: 02/20/2021   PT End of Session - 02/20/21 1018    Visit Number 16    Number of Visits 22    Date for PT Re-Evaluation 03/06/21    PT Start Time 1016    PT Stop Time 1100    PT Time Calculation (min) 44 min    Activity Tolerance Patient tolerated treatment well           History reviewed. No pertinent past medical history.  History reviewed. No pertinent surgical history.  There were no vitals filed for this visit.   Subjective Assessment - 02/20/21 1018    Subjective Patient reports that she saw the chiropractor Thursday and he worked to loosen the neck and spine and she feels that helped. Working at home as much as she can stand and having her husband help.    Currently in Pain? Yes    Pain Score 2     Pain Location Shoulder    Pain Orientation Left    Pain Descriptors / Indicators Aching;Sore    Pain Type Chronic pain;Surgical pain                             [facility] Adult PT Treatment/Exercise - 02/20/21 0001      Shoulder Exercises: Stretch   Internal Rotation Stretch 3 reps    Internal Rotation Stretch Limitations sleeper stretch adding noodle distal arm to improve alignment of shd joint; repeated with strap small ball anterior shoulder to prevent shoulder from rolling forward    Other Shoulder Stretches doorway x 3 positions 20-30 sec x 3 reps each position; stepping under ball to address shoulder flexion 30 sec x 2; repeated stepping under dowel      Manual Therapy   Manual Therapy Joint mobilization;Soft tissue mobilization;Passive ROM    Manual therapy comments to reduce muscle guarding, improve joint mobility,  improve ROM, decrease pain    Joint Mobilization sup>inferior glide grade II-III varying planes, GH AP mobs grade III, shoulder distraction between stretching for relaxation    Soft tissue mobilization anterior shoulder/arm    Myofascial Release pec/anterior shoulder    Passive ROM shoulder flexion; IR; ext; horizontal abdcution; ER                  PT Education - 02/20/21 1108    Education Details HEP    Person(s) Educated Patient    Methods Explanation;Demonstration;Tactile cues;Verbal cues;Handout    Comprehension Verbalized understanding;Returned demonstration;Verbal cues required;Tactile cues required            PT Short Term Goals - 12/23/20 1030      PT SHORT TERM GOAL #1   Title The patient will return demo HEP for AAROM to 90 degrees and isometric strengthening.    Time 4    Period Weeks    Status Achieved    Target Date 12/23/20      PT SHORT TERM GOAL #2   Title The patient will report no pain at rest.    Baseline 5/10 baseline; Update 4/10 with tightness    Time 6    Period Weeks    Status Not Met  Target Date 12/23/20      PT SHORT TERM GOAL #3   Title The patient will tolerate 90 degrees AAROM without shoulder pain.    Time 6    Period Weeks    Status Achieved    Target Date 12/23/20             PT Long Term Goals - 02/15/21 1220      PT LONG TERM GOAL #1   Title The patient will be indep with HEP progression within parameters of protocol.    Time 6    Period Weeks    Status On-going      PT LONG TERM GOAL #2   Title The patient will be able to return to daily household activities (folding laundry, light cleaning) with pain in L shoulder < or equal to 2/10.    Baseline pain 2/10 with household activities. Not folding laundry yet.    Time 6    Period Weeks    Status Partially Met      PT LONG TERM GOAL #3   Title The patient will reduce functional limitation from FOTO from 75% to < or equal to 39%.    Time 6    Period Weeks     Status On-going      PT LONG TERM GOAL #4   Title The patient will reach to an overhead shelf without c/o L shoulder pain.    Time 6    Period Weeks    Status Achieved      PT LONG TERM GOAL #5   Title The patient will be able to reach behind her head for self care without L shoulder pain (as protocol allows).    Time 6    Period Weeks    Status Partially Met                 Plan - 02/20/21 1025    Clinical Impression Statement Saw the chiropractor Thursday and he worked on her neck and shoulder which seemed to help. Added exercises for home and continued with mobilization and PROM/stretching Lt shoulder.    Rehab Potential Good    PT Frequency 2x / week    PT Duration 6 weeks    PT Treatment/Interventions ADLs/Self Care Home Management;Patient/family education;Therapeutic exercise;Therapeutic activities;Taping;Manual techniques;Dry needling;Vasopneumatic Device;Electrical Stimulation;Cryotherapy;Moist Heat    PT Next Visit Plan continue with exercise; manual work; mobilization; stretching    PT Home Exercise Plan [id number]    Consulted and Agree with Plan of Care Patient           Patient will benefit from skilled therapeutic intervention in order to improve the following deficits and impairments:     Visit Diagnosis: Acute pain of left shoulder  Muscle weakness (generalized)  Localized edema     Problem List There are no problems to display for this patient.   [place], MPH  02/20/2021, 12:55 PM  [facility] [place], [place], [address] Phone: [phone]   Fax:  [phone]  Name: Julia Walter MRN: [id number] Date of Birth: 11/10/1976

## 2021-02-20 NOTE — Patient Instructions (Signed)
Access Code: [email or web address]: [email or web address]: 03/14/2022Prepared by: Khayla Koppenhaver HoltProgram Notes * Exercises  Shoulder Internal Rotation Reactive Isometrics - 2 x daily - 7 x weekly - 1 sets - 10 reps  Shoulder External Rotation Reactive Isometrics - 2 x daily - 7 x weekly - 1 sets - 10 reps  Seated Upper Trapezius Stretch - 2 x daily - 7 x weekly - 1 sets - 2 reps - 30 seconds hold  Supine Shoulder Flexion Extension AAROM with Dowel - 2 x daily - 7 x weekly - 1 sets - 10 reps  Sleeper Stretch - 2 x daily - 7 x weekly - 1 sets - 3 reps - 20-30 seconds hold  Standing Shoulder External Rotation Stretch in Doorway - 2 x daily - 7 x weekly - 1 sets - 3 reps - 30 seconds hold  Standing Shoulder Abduction AAROM with Dowel - 2 x daily - 7 x weekly - 1 sets - 10 reps  Standing Anatomical Position with Scapular Retraction and Depression at Wall - 2 x daily - 7 x weekly - 1 sets - 10 reps  Standing Shoulder Extension with Dowel - 2 x daily - 7 x weekly - 1 sets - 10 reps  Standing Single Arm Shoulder Flexion Stretch on Wall - 2 x daily - 7 x weekly - 1 sets - 10 reps  Chest Mobilization with Small Ball - 2 x daily - 7 x weekly - 1 sets - 10 reps  Standing Bilateral Low Shoulder Row with Anchored Resistance - 1 x daily - 3 x weekly - 2 sets - 10 reps  Supine Shoulder External Rotation with Resistance - 1 x daily - 3 x weekly - 2 sets - 10 reps  Standing Shoulder Internal Rotation Stretch with Towel - 1 x daily - 7 x weekly - 1 sets - 3 reps - 30 hold  Supine Chest Stretch with Elbows Bent - 1 x daily - 7 x weekly - 1 sets - 2 reps - 30 hold  Supine Shoulder Abduction AAROM with Dowel - 1 x daily - 7 x weekly - 1 sets - 5 reps - 15 hold  Doorway Pec Stretch at 60 Degrees Abduction - 3 x daily - 7 x weekly - 3 reps - 1 sets  Doorway Pec Stretch at 90 Degrees Abduction - 3 x daily - 7 x weekly - 3 reps - 1 sets - 30 seconds hold  Doorway Pec Stretch at 120 Degrees Abduction - 3 x daily - 7 x  weekly - 3 reps - 1 sets - 30 second hold hold  Standing Shoulder Flexion Wall Walk - 2 x daily - 7 x weekly - 1 sets - 3 reps - 30 sec hold

## 2021-02-22 ENCOUNTER — Other Ambulatory Visit: Payer: Self-pay

## 2021-02-22 ENCOUNTER — Other Ambulatory Visit: Payer: Self-pay | Admitting: Family Medicine

## 2021-02-22 ENCOUNTER — Ambulatory Visit (INDEPENDENT_AMBULATORY_CARE_PROVIDER_SITE_OTHER): Payer: 59 | Admitting: Rehabilitative and Restorative Service Providers"

## 2021-02-22 DIAGNOSIS — R600 Localized edema: Secondary | ICD-10-CM | POA: Diagnosis not present

## 2021-02-22 DIAGNOSIS — M6281 Muscle weakness (generalized): Secondary | ICD-10-CM

## 2021-02-22 DIAGNOSIS — M25512 Pain in left shoulder: Secondary | ICD-10-CM | POA: Diagnosis not present

## 2021-02-22 NOTE — Therapy (Signed)
[facility] [place], [place], [address] Phone: [phone]   Fax:  [phone]  Physical Therapy Treatment  Patient Details  Name: Julia Walter MRN: [id number] Date of Birth: Mar 31, 1976 Referring Provider (PT): Meredith Pel, MD   Encounter Date: 02/22/2021   PT End of Session - 02/22/21 1110    Visit Number 17    Number of Visits 22    Date for PT Re-Evaluation 03/06/21    Authorization - Visit Number 14    Authorization - Number of Visits 25    PT Start Time 1106    PT Stop Time 1200    PT Time Calculation (min) 54 min    Activity Tolerance Patient tolerated treatment well           No past medical history on file.  No past surgical history on file.  There were no vitals filed for this visit.   Subjective Assessment - 02/22/21 1107    Subjective The patient is sore today.  She feels the chiropractor loosened her back and neck last week.  She is doing her laundry (all aspects).  She notes she is getting more ROM>    Patient Stated Goals "I would like to have a real life again."    Currently in Pain? Yes    Pain Score 3     Pain Location Shoulder    Pain Orientation Left    Pain Descriptors / Indicators Aching;Sore    Pain Type Chronic pain    Pain Onset More than a month ago    Pain Frequency Intermittent    Aggravating Factors  sore from exercises    Pain Relieving Factors TENS, DN              [facility] PT Assessment - 02/22/21 1111      Assessment   Medical Diagnosis Labral Repair L shoulder    Referring Provider (PT) Meredith Pel, MD    Onset Date/Surgical Date 10/24/20    Hand Dominance Right      AROM   Left Shoulder Flexion 145 Degrees      PROM   Left Shoulder Flexion 160 Degrees   supine                        [facility] Adult PT Treatment/Exercise - 02/22/21 1111      Exercises   Exercises Shoulder      Shoulder Exercises: Supine    Horizontal ABduction PROM;AROM;Left;10 reps    External Rotation PROM;AROM;Left;10 reps    Internal Rotation AROM;Left;5 reps    Flexion AROM;PROM;Left;10 reps    ABduction PROM;AROM;Left;10 reps      Shoulder Exercises: Sidelying   External Rotation AROM;Left;5 reps    Internal Rotation AROM;Left;PROM;5 reps    Internal Rotation Limitations reachign to pocket with overpressure through arm to stretch anterior shoulder    ABduction PROM;Left    ABduction Limitations pin and stretdch to scapula with tissue lengthening      Shoulder Exercises: Standing   Flexion AROM;Left;10 reps    Flexion Limitations along door frame      Shoulder Exercises: Pulleys   Flexion 2 minutes      Shoulder Exercises: ROM/Strengthening   UBE (Upper Arm Bike) L4 x 2 minutes forward and 1 minute backwawrd      Shoulder Exercises: Stretch   Corner Stretch Limitations door frame stretch x 2 reps x 30 seconds  Manual Therapy   Manual Therapy Joint mobilization;Soft tissue mobilization;Passive ROM;Myofascial release    Manual therapy comments skilled palpation to assess response to STM and DN    Joint Mobilization grade III into ER and with AP  mobs, also with AC join AP mobs, then PA mobs grade II    Soft tissue mobilization IASTM, STM over parascapular and proximal UE musculature    Myofascial Release anterior shoulder/ pec    Passive ROM in all planes to tissue tolerance                    PT Short Term Goals - 12/23/20 1030      PT SHORT TERM GOAL #1   Title The patient will return demo HEP for AAROM to 90 degrees and isometric strengthening.    Time 4    Period Weeks    Status Achieved    Target Date 12/23/20      PT SHORT TERM GOAL #2   Title The patient will report no pain at rest.    Baseline 5/10 baseline; Update 4/10 with tightness    Time 6    Period Weeks    Status Not Met    Target Date 12/23/20      PT SHORT TERM GOAL #3   Title The patient will tolerate 90 degrees  AAROM without shoulder pain.    Time 6    Period Weeks    Status Achieved    Target Date 12/23/20             PT Long Term Goals - 02/22/21 1110      PT LONG TERM GOAL #1   Title The patient will be indep with HEP progression within parameters of protocol.    Time 6    Period Weeks    Status On-going    Target Date 03/06/21      PT LONG TERM GOAL #2   Title The patient will be able to return to daily household activities (folding laundry, light cleaning) with pain in L shoulder < or equal to 2/10.    Baseline pain 2/10 with household activities. Not folding laundry yet.    Time 6    Period Weeks    Status Achieved      PT LONG TERM GOAL #3   Title The patient will reduce functional limitation from FOTO from 75% to < or equal to 39%.    Time 6    Period Weeks    Status On-going      PT LONG TERM GOAL #4   Title The patient will reach to an overhead shelf without c/o L shoulder pain.    Time 6    Period Weeks    Status Achieved      PT LONG TERM GOAL #5   Title The patient will be able to reach behind her head for self care without L shoulder pain (as protocol allows).    Time 6    Period Weeks    Status Partially Met                 Plan - 02/22/21 1111    Clinical Impression Statement The patient is improving ROM with all planes today.  She feels increased use during functional tasks of putting dishes away, shaving her axilla, and doing laundry.  PT to continue working to [employer].    Rehab Potential Good    PT Frequency 2x / week    PT  Duration 6 weeks    PT Treatment/Interventions ADLs/Self Care Home Management;Patient/family education;Therapeutic exercise;Therapeutic activities;Taping;Manual techniques;Dry needling;Vasopneumatic Device;Electrical Stimulation;Cryotherapy;Moist Heat    PT Next Visit Plan continue with exercise; manual work; mobilization; stretching    PT Home Exercise Plan [id number]    Consulted and Agree with Plan of Care Patient            Patient will benefit from skilled therapeutic intervention in order to improve the following deficits and impairments:     Visit Diagnosis: Acute pain of left shoulder  Muscle weakness (generalized)  Localized edema     Problem List There are no problems to display for this patient.   [place], [place] 02/22/2021, 12:32 PM  [facility] [place], [place], [address] Phone: [phone]   Fax:  [phone]  Name: Julia Walter MRN: [id number] Date of Birth: 06-Jul-1976

## 2021-02-23 ENCOUNTER — Other Ambulatory Visit: Payer: Self-pay | Admitting: Family Medicine

## 2021-02-28 ENCOUNTER — Ambulatory Visit (INDEPENDENT_AMBULATORY_CARE_PROVIDER_SITE_OTHER): Payer: 59 | Admitting: Rehabilitative and Restorative Service Providers"

## 2021-02-28 ENCOUNTER — Encounter: Payer: Self-pay | Admitting: Rehabilitative and Restorative Service Providers"

## 2021-02-28 ENCOUNTER — Other Ambulatory Visit: Payer: Self-pay

## 2021-02-28 DIAGNOSIS — R600 Localized edema: Secondary | ICD-10-CM

## 2021-02-28 DIAGNOSIS — M6281 Muscle weakness (generalized): Secondary | ICD-10-CM

## 2021-02-28 DIAGNOSIS — M25512 Pain in left shoulder: Secondary | ICD-10-CM | POA: Diagnosis not present

## 2021-02-28 NOTE — Therapy (Signed)
[place], [place], [address] Phone: [phone]   Fax:  [phone]  Physical Therapy Treatment  Patient Details  Name: Julia Walter MRN: [id number] Date of Birth: August 03, 1976 Referring Provider (PT): Meredith Pel, MD   Encounter Date: 02/28/2021   PT End of Session - 02/28/21 1025    Visit Number 18    Number of Visits 22    Date for PT Re-Evaluation 03/06/21    Authorization - Visit Number 15    Authorization - Number of Visits 25    PT Start Time 4765    PT Stop Time 4650    PT Time Calculation (min) 42 min    Activity Tolerance Patient tolerated treatment well           History reviewed. No pertinent past medical history.  History reviewed. No pertinent surgical history.  There were no vitals filed for this visit.   Subjective Assessment - 02/28/21 1024    Subjective The patient is able to pull her hair back in a pony tail this week.    Patient Stated Goals "I would like to have a real life again."    Currently in Pain? Yes    Pain Score 1    at rest= no pain; after exercise gets pain at reest   Pain Location Shoulder    Pain Orientation Left    Pain Descriptors / Indicators Aching;Sore    Pain Type Chronic pain    Aggravating Factors  exercise    Pain Relieving Factors TENS, DN              [facility] PT Assessment - 02/28/21 1026      Assessment   Medical Diagnosis Labral Repair L shoulder    Referring Provider (PT) Meredith Pel, MD    Onset Date/Surgical Date 10/24/20      AROM   Left Shoulder Flexion 142 Degrees      PROM   Left Shoulder Flexion 152 Degrees                         [facility] Adult PT Treatment/Exercise - 02/28/21 1026      Exercises   Exercises Shoulder      Shoulder Exercises: Seated   External Rotation Strengthening;Left;10 reps    External Rotation Weight (lbs) 2    External Rotation Limitations cues to avoid  thoracic extension and trunk rotation      Shoulder Exercises: Prone   Other Prone Exercises quadriped cat/cow for scapular mobilization; plank x 5 reps x 8 seconds    Other Prone Exercises quadriped with thread the needle      Shoulder Exercises: Sidelying   ABduction PROM;Left    ABduction Limitations pin and stretdch to scapula with tissue lengthening    Other Sidelying Exercises diagonals working D1 and D2 motions with AAROM and then passive overpressure at end range      Shoulder Exercises: Standing   Flexion Strengthening;Left;5 reps    Shoulder Flexion Weight (lbs) 2    Flexion Limitations lifting to a high shelf    ABduction AROM;Strengthening;Left;5 reps    ABduction Limitations bent arm abduction to 90 degrees, then tried wit 2 lbs with shoulder shrug, therefore no weight; also performed with passive lifting to 90 and cues to engage deltoid with isometrics or controlled lowering    Extension Strengthening;Left;10 reps    Extension Weight (lbs) 2    Other  Standing Exercises Scaption x 2 lbs x 12 reps    Other Standing Exercises Wall leans with scapular protraction/retraction      Shoulder Exercises: ROM/Strengthening   UBE (Upper Arm Bike) L4 x 2.25 minutes forward/ 1.25 minutes back    Other ROM/Strengthening Exercises wall walking into abduction with cues to drop L shoulder      Manual Therapy   Manual Therapy Soft tissue mobilization;Scapular mobilization;Myofascial release;Joint mobilization    Manual therapy comments skilled palpation to assess response to STM and DN    Joint Mobilization grade II for relaxation between passive stretching    Soft tissue mobilization STM biceps, deltoid    Myofascial Release pecs    Scapular Mobilization sidelying to free up L parascapular musculature            Trigger Point Dry Needling - 02/28/21 1334    Consent Given? Yes    Education Handout Provided Previously provided    Muscles Treated Upper Quadrant Deltoid;Biceps     Deltoid Response Palpable increased muscle length    Biceps Response Palpable increased muscle length                  PT Short Term Goals - 12/23/20 1030      PT SHORT TERM GOAL #1   Title The patient will return demo HEP for AAROM to 90 degrees and isometric strengthening.    Time 4    Period Weeks    Status Achieved    Target Date 12/23/20      PT SHORT TERM GOAL #2   Title The patient will report no pain at rest.    Baseline 5/10 baseline; Update 4/10 with tightness    Time 6    Period Weeks    Status Not Met    Target Date 12/23/20      PT SHORT TERM GOAL #3   Title The patient will tolerate 90 degrees AAROM without shoulder pain.    Time 6    Period Weeks    Status Achieved    Target Date 12/23/20             PT Long Term Goals - 02/28/21 1324      PT LONG TERM GOAL #1   Title The patient will be indep with HEP progression within parameters of protocol.    Time 6    Period Weeks    Status On-going      PT LONG TERM GOAL #2   Title The patient will be able to return to daily household activities (folding laundry, light cleaning) with pain in L shoulder < or equal to 2/10.    Baseline pain 2/10 with household activities. Not folding laundry yet.    Time 6    Period Weeks    Status Achieved      PT LONG TERM GOAL #3   Title The patient will reduce functional limitation from FOTO from 75% to < or equal to 39%.    Baseline 39% limitation on 02/28/21    Time 6    Period Weeks    Status Achieved      PT LONG TERM GOAL #4   Title The patient will reach to an overhead shelf without c/o L shoulder pain.    Time 6    Period Weeks    Status Achieved      PT LONG TERM GOAL #5   Title The patient will be able to reach behind her head for self  care without L shoulder pain (as protocol allows).    Time 6    Period Weeks    Status Achieved                 Plan - 02/28/21 1325    Clinical Impression Statement The patient has met 4/5 LTGs with  recent improvement in ADL use and ROM.  She continues with weakness for overhead lifting to a shelf and also has abnormal scapulohumeral rhythm with increased shoulder shrug with overhead motion.  PT is continuing to increase the load on the shoulder to work towards functional tasks.  Anticipate renewal with consideration of dec'ing frequency to continue progressing HEP and strengthening.    PT Treatment/Interventions ADLs/Self Care Home Management;Patient/family education;Therapeutic exercise;Therapeutic activities;Taping;Manual techniques;Dry needling;Vasopneumatic Device;Electrical Stimulation;Cryotherapy;Moist Heat    PT Next Visit Plan end ROM, strengthening overhead, isolating deltoid without shoulder shrug, STM/DN as needed    PT Home Exercise Plan [id number]    Consulted and Agree with Plan of Care Patient           Patient will benefit from skilled therapeutic intervention in order to improve the following deficits and impairments:     Visit Diagnosis: Acute pain of left shoulder  Muscle weakness (generalized)  Localized edema     Problem List There are no problems to display for this patient.   [place] , [place] 02/28/2021, 1:34 PM  [facility] [place], [place], [address] Phone: [phone]   Fax:  [phone]  Name: Julia Walter MRN: [id number] Date of Birth: Apr 22, 1976

## 2021-03-03 ENCOUNTER — Encounter: Payer: Self-pay | Admitting: Rehabilitative and Restorative Service Providers"

## 2021-03-03 ENCOUNTER — Other Ambulatory Visit: Payer: Self-pay

## 2021-03-03 ENCOUNTER — Ambulatory Visit (INDEPENDENT_AMBULATORY_CARE_PROVIDER_SITE_OTHER): Payer: 59 | Admitting: Rehabilitative and Restorative Service Providers"

## 2021-03-03 DIAGNOSIS — M25512 Pain in left shoulder: Secondary | ICD-10-CM

## 2021-03-03 DIAGNOSIS — M6281 Muscle weakness (generalized): Secondary | ICD-10-CM

## 2021-03-03 DIAGNOSIS — R600 Localized edema: Secondary | ICD-10-CM | POA: Diagnosis not present

## 2021-03-03 NOTE — Therapy (Signed)
[place], [place], [address] Phone: [phone]   Fax:  [phone]  Physical Therapy Treatment  Patient Details  Name: Julia Walter MRN: [id number] Date of Birth: 1976-08-31 Referring Provider (PT): Meredith Pel, MD   Encounter Date: 03/03/2021   PT End of Session - 03/03/21 1019    Visit Number 19    Number of Visits 22    Date for PT Re-Evaluation 03/06/21    Authorization - Visit Number 16    Authorization - Number of Visits 25    PT Start Time 4825    PT Stop Time 1102    PT Time Calculation (min) 44 min    Activity Tolerance Patient tolerated treatment well           History reviewed. No pertinent past medical history.  History reviewed. No pertinent surgical history.  There were no vitals filed for this visit.   Subjective Assessment - 03/03/21 1022    Subjective Stretches and DN have really helped. Feels like the joint has loosened up a lot but the muscles feel like they are still tight    Currently in Pain? Yes    Pain Score 1     Pain Location Shoulder    Pain Orientation Left    Pain Descriptors / Indicators Aching;Sore    Pain Type Chronic pain                             [facility] Adult PT Treatment/Exercise - 03/03/21 0001      Therapeutic Activites    Therapeutic Activities Other Therapeutic Activities    Other Therapeutic Activities lying supine arms in 70-80 deg abduction swim noodle along spine and then across thoracic spine 1-2 min each to open chest      Shoulder Exercises: Standing   Other Standing Exercises Wall leans with scapular protraction/retraction      Shoulder Exercises: Stretch   External Rotation Stretch 3 reps;20 seconds   hands behind head dropping elbows down   Wall Stretch - Flexion 2 reps;20 seconds    Other Shoulder Stretches Rt sidelying with hand on hip for shoulder ext/ER stretch x 60, 3 reps    Other Shoulder  Stretches doorway x 3 positions 20-30 sec x 3 reps each position; stepping under ball to address shoulder flexion 30 sec x 2; repeated stepping under dowel      Manual Therapy   Joint Mobilization Grade II/III inferior glide in sitting - shoulder in abducted positions - varying angle of abduction and ER sitting; supine AP and inferior glides varying angle of shoulder    Soft tissue mobilization STM biceps, anterior deltoid; upper trap; teres    Myofascial Release pecs; upper trap    Passive ROM in all planes to tissue tolerance pt sitting and supine    Manual Traction through long arm Lt throughout treatment to reduce tightness and guarding                    PT Short Term Goals - 12/23/20 1030      PT SHORT TERM GOAL #1   Title The patient will return demo HEP for AAROM to 90 degrees and isometric strengthening.    Time 4    Period Weeks    Status Achieved    Target Date 12/23/20      PT SHORT TERM GOAL #2   Title The  patient will report no pain at rest.    Baseline 5/10 baseline; Update 4/10 with tightness    Time 6    Period Weeks    Status Not Met    Target Date 12/23/20      PT SHORT TERM GOAL #3   Title The patient will tolerate 90 degrees AAROM without shoulder pain.    Time 6    Period Weeks    Status Achieved    Target Date 12/23/20             PT Long Term Goals - 02/28/21 1324      PT LONG TERM GOAL #1   Title The patient will be indep with HEP progression within parameters of protocol.    Time 6    Period Weeks    Status On-going      PT LONG TERM GOAL #2   Title The patient will be able to return to daily household activities (folding laundry, light cleaning) with pain in L shoulder < or equal to 2/10.    Baseline pain 2/10 with household activities. Not folding laundry yet.    Time 6    Period Weeks    Status Achieved      PT LONG TERM GOAL #3   Title The patient will reduce functional limitation from FOTO from 75% to < or equal to 39%.     Baseline 39% limitation on 02/28/21    Time 6    Period Weeks    Status Achieved      PT LONG TERM GOAL #4   Title The patient will reach to an overhead shelf without c/o L shoulder pain.    Time 6    Period Weeks    Status Achieved      PT LONG TERM GOAL #5   Title The patient will be able to reach behind her head for self care without L shoulder pain (as protocol allows).    Time 6    Period Weeks    Status Achieved                 Plan - 03/03/21 1024    Clinical Impression Statement Continued progress with increasing mobility and ROM; improving functional activites.    Rehab Potential Good    PT Frequency 2x / week    PT Duration 6 weeks    PT Treatment/Interventions ADLs/Self Care Home Management;Patient/family education;Therapeutic exercise;Therapeutic activities;Taping;Manual techniques;Dry needling;Vasopneumatic Device;Electrical Stimulation;Cryotherapy;Moist Heat    PT Next Visit Plan end ROM, strengthening overhead, isolating deltoid without shoulder shrug, STM/DN as needed    PT Home Exercise Plan [id number]    Consulted and Agree with Plan of Care Patient           Patient will benefit from skilled therapeutic intervention in order to improve the following deficits and impairments:     Visit Diagnosis: Acute pain of left shoulder  Muscle weakness (generalized)  Localized edema     Problem List There are no problems to display for this patient.   [place], MPH  03/03/2021, 11:04 AM  [facility] [place], [place], [address] Phone: [phone]   Fax:  [phone]  Name: Julia Walter MRN: [id number] Date of Birth: [birth year]

## 2021-03-07 ENCOUNTER — Encounter: Payer: Self-pay | Admitting: Rehabilitative and Restorative Service Providers"

## 2021-03-07 ENCOUNTER — Ambulatory Visit (INDEPENDENT_AMBULATORY_CARE_PROVIDER_SITE_OTHER): Payer: 59 | Admitting: Rehabilitative and Restorative Service Providers"

## 2021-03-07 ENCOUNTER — Other Ambulatory Visit: Payer: Self-pay

## 2021-03-07 DIAGNOSIS — M25512 Pain in left shoulder: Secondary | ICD-10-CM

## 2021-03-07 DIAGNOSIS — M6281 Muscle weakness (generalized): Secondary | ICD-10-CM

## 2021-03-07 DIAGNOSIS — R600 Localized edema: Secondary | ICD-10-CM

## 2021-03-07 NOTE — Therapy (Addendum)
[place], [place], [address] Phone: [phone]   Fax:  [phone]  Physical Therapy Treatment and Renewal Summary  Patient Details  Name: Julia Walter MRN: [id number] Date of Birth: [birth year] Referring Provider (PT): Meredith Pel, MD   Encounter Date: 03/07/2021   PT End of Session - 03/07/21 1103    Visit Number 20    Number of Visits 22    Date for PT Re-Evaluation 03/06/21    Authorization - Visit Number 17    Authorization - Number of Visits 25    PT Start Time 1059    PT Stop Time 6834    PT Time Calculation (min) 46 min    Activity Tolerance Patient tolerated treatment well           History reviewed. No pertinent past medical history.  History reviewed. No pertinent surgical history.  There were no vitals filed for this visit.   Subjective Assessment - 03/07/21 1103    Subjective Feels the DN will help the tightness inthe front of the Lt arm which seems to be limiting the movement more than anything. Returns to MD Monday. Feels looser following DN. Usually best results from DN in a couple of days.    Currently in Pain? Yes    Pain Score 2     Pain Location Shoulder    Pain Orientation Left    Pain Descriptors / Indicators Tightness    Pain Radiating Towards into the Lt neck area    Pain Onset More than a month ago    Pain Frequency Intermittent    Aggravating Factors  exercise/stretching    Pain Relieving Factors TENS DN                             [facility] Adult PT Treatment/Exercise - 03/07/21 0001      Shoulder Exercises: Stretch   External Rotation Stretch 3 reps;20 seconds   hands behind head dropping elbows down   Wall Stretch - Flexion 2 reps;20 seconds   stepping through doorway with dowel in doorway   Wall Stretch - ABduction 2 reps;30 seconds   T at wall stretching into horizontal abduction x 2; repeated in ER position at wall x2    Other Shoulder Stretches doorway x 3 positions 20-30 sec x 3 reps each position; stepping under ball to address shoulder flexion 30 sec x 2; repeated stepping under dowel      Manual Therapy   Joint Mobilization Grade II/III inferior glide in supine - shoulder in abducted positions - varying angle of abduction and ER supine; supine AP glides    Soft tissue mobilization STM biceps, anterior deltoid; upper trap    Myofascial Release anterior shoulder    Passive ROM shoulder flexioin; scaption; ER in scapular plane to pt tolerance    Manual Traction through long arm Lt throughout treatment to reduce tightness and guarding            Trigger Point Dry Needling - 03/07/21 0001    Consent Given? Yes    Education Handout Provided Previously provided    Muscles Treated Upper Quadrant Deltoid;Biceps    Electrical Stimulation Performed with Dry Needling Yes    Deltoid Response Palpable increased muscle length    Biceps Response Palpable increased muscle length                  PT Short  Term Goals - 12/23/20 1030      PT SHORT TERM GOAL #1   Title The patient will return demo HEP for AAROM to 90 degrees and isometric strengthening.    Time 4    Period Weeks    Status Achieved    Target Date 12/23/20      PT SHORT TERM GOAL #2   Title The patient will report no pain at rest.    Baseline 5/10 baseline; Update 4/10 with tightness    Time 6    Period Weeks    Status Not Met    Target Date 12/23/20      PT SHORT TERM GOAL #3   Title The patient will tolerate 90 degrees AAROM without shoulder pain.    Time 6    Period Weeks    Status Achieved    Target Date 12/23/20             PT Long Term Goals - 02/28/21 1324      PT LONG TERM GOAL #1   Title The patient will be indep with HEP progression within parameters of protocol.    Time 6    Period Weeks    Status On-going      PT LONG TERM GOAL #2   Title The patient will be able to return to daily household activities  (folding laundry, light cleaning) with pain in L shoulder < or equal to 2/10.    Baseline pain 2/10 with household activities. Not folding laundry yet.    Time 6    Period Weeks    Status Achieved      PT LONG TERM GOAL #3   Title The patient will reduce functional limitation from FOTO from 75% to < or equal to 39%.    Baseline 39% limitation on 02/28/21    Time 6    Period Weeks    Status Achieved      PT LONG TERM GOAL #4   Title The patient will reach to an overhead shelf without c/o L shoulder pain.    Time 6    Period Weeks    Status Achieved      PT LONG TERM GOAL #5   Title The patient will be able to reach behind her head for self care without L shoulder pain (as protocol allows).    Time 6    Period Weeks    Status Achieved            UPDATED LONG TERM GOALS:  PT Long Term Goals - 03/08/21 0725      PT LONG TERM GOAL #1   Title The patient will be indep with HEP progression.    Time 6    Period Weeks    Status Revised    Target Date 04/18/21      PT LONG TERM GOAL #2   Title The patient will be able to lift 3 lb object to overhead shelf.    Time 6    Period Weeks    Status New    Target Date 04/18/21      PT LONG TERM GOAL #3   Title The patient will reduce functional limitation from FOTO from 75% to < or equal to 35%.    Baseline 39% limitation on 02/28/21    Time 6    Period Weeks    Status Revised    Target Date 04/18/21      PT LONG TERM GOAL #4   Title  The patient will improve L shoulder A/ROM flexion to> 150 degrees and ER to > 50 degrees.    Time 6    Period Weeks    Status Revised    Target Date 04/18/21      PT LONG TERM GOAL #5   Title The patient will demonstrate dec'd compensatory movements of shoulder shrug and thoracic extension during overhead motion.    Time 6    Period Weeks    Status New    Target Date 04/18/21               Plan - 03/07/21 1107    Clinical Impression Statement Gradual progress. Continued muscular  and capsular tightness at end ranges Lt shoulder. Increasing functional range and activity. Patient is working consistently on HEP. Tolerates PROM/stretching well in clinic.    Rehab Potential Good    PT Frequency 2x / week    PT Duration 6 weeks    PT Treatment/Interventions ADLs/Self Care Home Management;Patient/family education;Therapeutic exercise;Therapeutic activities;Taping;Manual techniques;Dry needling;Vasopneumatic Device;Electrical Stimulation;Cryotherapy;Moist Heat    PT Next Visit Plan end ROM, strengthening overhead, isolating deltoid without shoulder shrug, STM/DN as needed    PT Home Exercise Plan [email or web address]    Consulted and Agree with Plan of Care Patient          * frequency/duration will be modified based on insurance coverage and progress (anticipate dec'ing to 1x/week)  Patient will benefit from skilled therapeutic intervention in order to improve the following deficits and impairments:     Visit Diagnosis: Acute pain of left shoulder  Muscle weakness (generalized)  Localized edema     Problem List There are no problems to display for this patient.   [place], MPH  03/07/2021, 1:09 PM Rudell Cobb, PT  [facility] [place], [place], [address] Phone: [phone]   Fax:  [phone]  Name: Julia Walter MRN: [id number] Date of Birth: 02/19/1976

## 2021-03-08 NOTE — Addendum Note (Signed)
Addended by: Margretta Ditty M on: 03/08/2021 07:31 AM   Modules accepted: Orders

## 2021-03-09 ENCOUNTER — Other Ambulatory Visit: Payer: Self-pay

## 2021-03-09 ENCOUNTER — Ambulatory Visit (INDEPENDENT_AMBULATORY_CARE_PROVIDER_SITE_OTHER): Payer: 59 | Admitting: Rehabilitative and Restorative Service Providers"

## 2021-03-09 ENCOUNTER — Encounter: Payer: Self-pay | Admitting: Rehabilitative and Restorative Service Providers"

## 2021-03-09 ENCOUNTER — Other Ambulatory Visit: Payer: Self-pay | Admitting: Family Medicine

## 2021-03-09 DIAGNOSIS — M6281 Muscle weakness (generalized): Secondary | ICD-10-CM | POA: Diagnosis not present

## 2021-03-09 DIAGNOSIS — M25512 Pain in left shoulder: Secondary | ICD-10-CM

## 2021-03-09 MED FILL — BACLOFEN 10 MG TABS: 10 | 10 days supply | Qty: 30 | Fill #4

## 2021-03-09 MED FILL — DICLOFENAC SOD EC 75 MG TAB: 75 | 30 days supply | Qty: 60 | Fill #2

## 2021-03-09 MED FILL — traMADol HCL 50 MG TABS: 50 | 10 days supply | Qty: 30 | Fill #0

## 2021-03-09 NOTE — Therapy (Signed)
[facility]  [place], [place], [address] Phone: [phone]   Fax:  [phone]  Physical Therapy Treatment  Patient Details  Name: Julia Walter MRN: [id number] Date of Birth: [birth year] Referring Provider (PT): Meredith Pel, MD   Encounter Date: 03/09/2021   PT End of Session - 03/09/21 1349    Visit Number 21    Number of Visits 30    Date for PT Re-Evaluation 04/18/21    Authorization - Visit Number 18    Authorization - Number of Visits 25    PT Start Time 1150    PT Stop Time 1232    PT Time Calculation (min) 42 min    Activity Tolerance Patient tolerated treatment well           History reviewed. No pertinent past medical history.  History reviewed. No pertinent surgical history.  There were no vitals filed for this visit.   Subjective Assessment - 03/09/21 1149    Subjective The patient reports that if she is doing "normal stuff" it is 1-2/10 discomfort.  "I've been pushing the PT so hard that it gets real worked up."    Patient Stated Goals "I would like to have a real life again."    Currently in Pain? Yes    Pain Score 1     Pain Location Shoulder    Pain Orientation Left    Pain Descriptors / Indicators Tightness    Pain Onset More than a month ago    Pain Frequency Intermittent              [facility] PT Assessment - 03/09/21 1152      Assessment   Medical Diagnosis Labral Repair L shoulder    Referring Provider (PT) Meredith Pel, MD    Onset Date/Surgical Date 10/24/20    Hand Dominance Right      AROM   Left Shoulder Flexion 162 Degrees    Left Shoulder ABduction 150 Degrees   lifts from frontal plane anteriorly into scapular plane   Left Shoulder External Rotation 60 Degrees   with arm moving into scapular plane     PROM   Left Shoulder Flexion 165 Degrees                         [facility] Adult PT Treatment/Exercise - 03/09/21 1152       Exercises   Exercises Shoulder      Shoulder Exercises: Supine   External Rotation AROM;Left;5 reps    External Rotation Limitations passive overpressure    ABduction AROM;Left;10 reps    Diagonals AROM;Left;10 reps      Shoulder Exercises: Prone   Retraction Strengthening;Left;12 reps    Retraction Limitations cues for scaular retraction    Flexion Strengthening;Left;5 reps    Flexion Limitations with muscle guarding/pain that starts after 5 reps    Other Prone Exercises quadriped cat/dog x 5 reps and thoracic rotation    Other Prone Exercises modified plank with scapular protraction/retraction      Shoulder Exercises: Sidelying   Flexion AROM;Left;5 reps    Flexion Limitations during scapular release    ABduction AROM;PROM;Left;5 reps    ABduction Limitations pin and stretdch to scapula with tissue lengthening      Shoulder Exercises: Standing   Flexion Strengthening;Left;10 reps    Flexion Limitations scaption      Shoulder Exercises: ROM/Strengthening   UBE (Upper Arm Bike) L5 x 2  minutes forward, 1 minute backward      Shoulder Exercises: Stretch   External Rotation Stretch 2 reps;30 seconds    Wall Stretch - Flexion 2 reps;30 seconds      Manual Therapy   Manual Therapy Joint mobilization;Soft tissue mobilization    Manual therapy comments to improve motion and decrease muscle guarding    Joint Mobilization grade II-III inferior GH mob    Soft tissue mobilization STM L biceps, anterior deltoid, middle deltoid    Passive ROM shoulder flexioin; scaption; ER in scapular plane to pt tolerance                    PT Short Term Goals - 12/23/20 1030      PT SHORT TERM GOAL #1   Title The patient will return demo HEP for AAROM to 90 degrees and isometric strengthening.    Time 4    Period Weeks    Status Achieved    Target Date 12/23/20      PT SHORT TERM GOAL #2   Title The patient will report no pain at rest.    Baseline 5/10 baseline; Update 4/10 with  tightness    Time 6    Period Weeks    Status Not Met    Target Date 12/23/20      PT SHORT TERM GOAL #3   Title The patient will tolerate 90 degrees AAROM without shoulder pain.    Time 6    Period Weeks    Status Achieved    Target Date 12/23/20             PT Long Term Goals - 03/09/21 1350      PT LONG TERM GOAL #1   Title The patient will be indep with HEP progression.    Time 6    Period Weeks    Status On-going      PT LONG TERM GOAL #2   Title The patient will be able to lift 3 lb object to overhead shelf.    Time 6    Period Weeks    Status On-going      PT LONG TERM GOAL #3   Title The patient will reduce functional limitation from FOTO from 75% to < or equal to 35%.    Baseline 39% limitation on 02/28/21    Time 6    Period Weeks    Status On-going      PT LONG TERM GOAL #4   Title The patient will improve L shoulder A/ROM flexion to> 150 degrees and ER to > 50 degrees.    Time 6    Period Weeks    Status Achieved      PT LONG TERM GOAL #5   Title The patient will demonstrate dec'd compensatory movements of shoulder shrug and thoracic extension during overhead motion.    Time 6    Period Weeks    Status On-going                 Plan - 03/09/21 1350    Clinical Impression Statement The patient follows up with MD on [place].  She has made significant progress in AROM, however still has compensatory motion with scapular elevation worse during bent arm abduction and frontal plane motion.  She has decreased anterior/inferior glide of the humerus leading to scapular compensations and muscle tightness.  Patient to f/u with PT once plan established with MD.    PT Treatment/Interventions ADLs/Self Care Home  Management;Patient/family education;Therapeutic exercise;Therapeutic activities;Taping;Manual techniques;Dry needling;Vasopneumatic Device;Electrical Stimulation;Cryotherapy;Moist Heat    PT Next Visit Plan end ROM, strengthening overhead, isolating  deltoid without shoulder shrug, STM/DN as needed    PT Home Exercise Plan [email or web address]    Consulted and Agree with Plan of Care Patient           Patient will benefit from skilled therapeutic intervention in order to improve the following deficits and impairments:     Visit Diagnosis: Acute pain of left shoulder  Muscle weakness (generalized)     Problem List There are no problems to display for this patient.   [place] , [place] 03/09/2021, 1:57 PM  [facility] [place], [place], [address] Phone: [phone]   Fax:  [phone]  Name: Julia Walter MRN: [id number] Date of Birth: 1976/02/02

## 2021-03-11 ENCOUNTER — Other Ambulatory Visit (HOSPITAL_COMMUNITY): Payer: Self-pay

## 2021-03-13 ENCOUNTER — Encounter: Payer: Self-pay | Admitting: Orthopedic Surgery

## 2021-03-13 ENCOUNTER — Other Ambulatory Visit: Payer: Self-pay

## 2021-03-13 ENCOUNTER — Ambulatory Visit (INDEPENDENT_AMBULATORY_CARE_PROVIDER_SITE_OTHER): Payer: 59 | Admitting: Orthopedic Surgery

## 2021-03-13 DIAGNOSIS — M7502 Adhesive capsulitis of left shoulder: Secondary | ICD-10-CM | POA: Diagnosis not present

## 2021-03-13 NOTE — Progress Notes (Signed)
Office Visit Note   Patient: Julia Walter           Date of Birth: 02-28-1976           MRN: [id number] Visit Date: 03/13/2021 Requested by: No referring provider defined for this encounter. PCP: Patient, No Pcp Per (Inactive)  Subjective: Chief Complaint  Patient presents with  . Left Shoulder - Follow-up    HPI: [name] is a 45 y.o. female who presents to the office complaining of left shoulder pain.  Patient returns for evaluation of left shoulder adhesive capsulitis.  She reports following her glenohumeral injection in early March she has had significant lasting relief of her shoulder pain.  Does not wake with pain at night.  Her function is improving.  She is going to physical therapy 2 times per week where they are working on passive motion and dry needling.  She is also going to see her chiropractor 1 time per week.  She is doing home exercises 3 times per day.  She is still having difficulty unhooking her bra.  She has some pain that travels down to her elbow but overall she is continuing to improve.                ROS: All systems reviewed are negative as they relate to the chief complaint within the history of present illness.  Patient denies fevers or chills.  Assessment & Plan: Visit Diagnoses:  1. Adhesive capsulitis of left shoulder     Plan: Patient is a 45 year old female who presents for reevaluation of left shoulder adhesive capsulitis.  She has made improved progress since last office visit, particularly with forward flexion and some with abduction.  Discussed options available to patient.  She would like to do a little bit less formal therapy and work on home exercises and live with her shoulder for the next month and see how it feels.  If she is still feeling like it is significantly impeding her lifestyle, she will reach out to the office to discuss manipulation under anesthesia.  Plan to follow-up in 2 months for clinical recheck regarding her range of  motion.  If she is feeling close to 100% at that time then she may cancel her appointment.  Patient agreed with plan.  Follow-Up Instructions: No follow-ups on file.   Orders:  No orders of the defined types were placed in this encounter.  No orders of the defined types were placed in this encounter.     Procedures: No procedures performed   Clinical Data: No additional findings.  Objective: Vital Signs: There were no vitals taken for this visit.  Physical Exam:  Constitutional: Patient appears well-developed HEENT:  Head: Normocephalic Eyes:EOM are normal Neck: Normal range of motion Cardiovascular: Normal rate Pulmonary/chest: Effort normal Neurologic: Patient is alert Skin: Skin is warm Psychiatric: Patient has normal mood and affect  Ortho Exam: Ortho exam demonstrates left shoulder with 45 degrees external rotation, 80 degrees abduction, 150 degrees forward flexion.  No crepitus noted with passive motion of the shoulder.  She internally rotates behind the back almost to the same level of the contralateral shoulder but about 4 vertebral levels lower.  Excellent supination, bicep flexion strength.  Distal bicep tendon intact by palpation.  Specialty Comments:  No specialty comments available.  Imaging: No results found.   PMFS History: There are no problems to display for this patient.  No past medical history on file.  Family History  Problem Relation  Age of Onset  . Cancer Mother   . Diabetes Mother   . Heart failure Father   . Diabetes Father     No past surgical history on file. Social History   Occupational History  . Not on file  Tobacco Use  . Smoking status: Current Every Day Smoker    Packs/day: 1.00    Types: Cigarettes  . Smokeless tobacco: Never Used  Vaping Use  . Vaping Use: Never used  Substance and Sexual Activity  . Alcohol use: Never  . Drug use: Never  . Sexual activity: Yes

## 2021-03-14 ENCOUNTER — Encounter: Payer: 59 | Admitting: Rehabilitative and Restorative Service Providers"

## 2021-03-30 ENCOUNTER — Other Ambulatory Visit: Payer: Self-pay | Admitting: Family Medicine

## 2021-03-30 ENCOUNTER — Other Ambulatory Visit (HOSPITAL_COMMUNITY): Payer: Self-pay

## 2021-03-30 MED ORDER — TRAMADOL HCL 50 MG PO TABS
25.0000 mg | ORAL_TABLET | Freq: Three times a day (TID) | ORAL | 0 refills | Status: DC | PRN
  Filled 2021-03-30: qty 30, 10d supply, fill #0

## 2021-03-30 MED FILL — Baclofen Tab 10 MG: ORAL | 10 days supply | Qty: 30 | Fill #0 | Status: AC

## 2021-04-03 ENCOUNTER — Ambulatory Visit (INDEPENDENT_AMBULATORY_CARE_PROVIDER_SITE_OTHER): Payer: 59 | Admitting: Rehabilitative and Restorative Service Providers"

## 2021-04-03 ENCOUNTER — Encounter: Payer: Self-pay | Admitting: Rehabilitative and Restorative Service Providers"

## 2021-04-03 ENCOUNTER — Other Ambulatory Visit: Payer: Self-pay

## 2021-04-03 DIAGNOSIS — M6281 Muscle weakness (generalized): Secondary | ICD-10-CM

## 2021-04-03 DIAGNOSIS — R600 Localized edema: Secondary | ICD-10-CM | POA: Diagnosis not present

## 2021-04-03 DIAGNOSIS — M25512 Pain in left shoulder: Secondary | ICD-10-CM

## 2021-04-03 NOTE — Patient Instructions (Signed)
Access Code: [email or web address] URL: [email or web address] Date: 04/03/2021 Prepared by: Margretta Ditty  Program Notes *   Exercises Standing Single Arm Shoulder Flexion Stretch on Wall - 1 x daily - 7 x weekly - 1 sets - 10 reps Doorway Pec Stretch at 60 Degrees Abduction - 1 x daily - 7 x weekly - 3 reps - 1 sets Doorway Pec Stretch at 120 Degrees Abduction - 1 x daily - 7 x weekly - 3 reps - 1 sets - 30 second hold hold Standing Shoulder Internal Rotation Stretch with Towel - 1 x daily - 7 x weekly - 1 sets - 3 reps - 30 hold Seated Shoulder External Rotation in Abduction Supported with Dumbbell - 1 x daily - 7 x weekly - 1 sets - 10 reps Full Plank with Shoulder Taps - 1 x daily - 7 x weekly - 1 sets - 10 reps Prone Shoulder Row in Abduction - 1 x daily - 7 x weekly - 1 sets - 10 reps Supine Shoulder External Rotation with Dumbbell - 1 x daily - 7 x weekly - 1 sets - 5 reps

## 2021-04-03 NOTE — Therapy (Signed)
[place], [place], [address] Phone: [phone]   Fax:  [phone]  Physical Therapy Treatment and Renewal Summary  Patient Details  Name: Julia Walter MRN: [id number] Date of Birth: 1976/04/08 Referring Provider (PT): Meredith Pel, MD   Encounter Date: 04/03/2021   PT End of Session - 04/03/21 1105    Visit Number 22    Number of Visits 30    Date for PT Re-Evaluation 05/15/21    Authorization - Visit Number 63    Authorization - Number of Visits 25    PT Start Time 1100    PT Stop Time 1153    PT Time Calculation (min) 53 min    Activity Tolerance Patient tolerated treatment well    Behavior During Therapy [facility] for tasks assessed/performed           History reviewed. No pertinent past medical history.  History reviewed. No pertinent surgical history.  There were no vitals filed for this visit.   Subjective Assessment - 04/03/21 1100    Subjective The patient reports that she has tightened up without doing HEP 3x/day.  She is doing occasional stretching.  She took a break from therapy and tried to get back to life.  She found basic tasks are limited by weakness.  She started quilting again and noted that trying to put pressure through the fabric hurts the shoulder.  "I'm tight". The pain is anterior shoulder at the shoulder joint.              [facility] PT Assessment - 04/03/21 1113      Assessment   Medical Diagnosis Labral Repair L shoulder    Referring Provider (PT) Meredith Pel, MD    Onset Date/Surgical Date 10/24/20    Hand Dominance Right    Next MD Visit 02/13/21      AROM   Left Shoulder Flexion 155 Degrees    Left Shoulder ABduction 150 Degrees    Left Shoulder Internal Rotation --   to reach 2" below bra strap on L side   Left Shoulder External Rotation 55 Degrees   in supine                        [facility] Adult PT Treatment/Exercise -  04/03/21 1121      Exercises   Exercises Shoulder      Shoulder Exercises: Supine   External Rotation AROM;Left;10 reps    External Rotation Limitations with 2 lb weight    ABduction AROM;Left;PROM;5 reps      Shoulder Exercises: Seated   External Rotation Strengthening;Left;10 reps    External Rotation Weight (lbs) 1    External Rotation Limitations seated with elbow supported      Shoulder Exercises: Prone   Retraction Strengthening;Left;10 reps    External Rotation Strengthening;Left;5 reps    External Rotation Limitations with arm supported by PT    Horizontal ABduction 1 Strengthening;Left;5 reps    Other Prone Exercises modified plank with UE shoulder walk outs forward/back      Shoulder Exercises: Standing   Other Standing Exercises wall lean      Shoulder Exercises: ROM/Strengthening   UBE (Upper Arm Bike) L4 x 2 minutes forward/1 minute backward      Shoulder Exercises: Stretch   Corner Stretch Limitations door frame stretch x 30 seconds      Manual Therapy   Manual Therapy Soft tissue mobilization  Manual therapy comments skilled palpation to assess response to dry needling    Soft tissue mobilization STM infraspinatus, biceps and deltoic, pec release    Passive ROM overpressure into ER and flexion            Trigger Point Dry Needling - 04/03/21 1342    Consent Given? Yes    Education Handout Provided Previously provided    Muscles Treated Upper Quadrant Deltoid;Biceps;Infraspinatus    Infraspinatus Response Twitch response elicited;Palpable increased muscle length    Deltoid Response Palpable increased muscle length    Biceps Response Palpable increased muscle length                PT Education - 04/03/21 1337    Education Details modified HEP focusing on 1x/day and shorter duration of activities    Person(s) Educated Patient    Methods Explanation;Demonstration;Handout    Comprehension Verbalized understanding;Returned demonstration             PT Short Term Goals - 12/23/20 1030      PT SHORT TERM GOAL #1   Title The patient will return demo HEP for AAROM to 90 degrees and isometric strengthening.    Time 4    Period Weeks    Status Achieved    Target Date 12/23/20      PT SHORT TERM GOAL #2   Title The patient will report no pain at rest.    Baseline 5/10 baseline; Update 4/10 with tightness    Time 6    Period Weeks    Status Not Met    Target Date 12/23/20      PT SHORT TERM GOAL #3   Title The patient will tolerate 90 degrees AAROM without shoulder pain.    Time 6    Period Weeks    Status Achieved    Target Date 12/23/20             PT Long Term Goals - 04/03/21 1352      PT LONG TERM GOAL #1   Title The patient will be indep with HEP progression.    Time 6    Period Weeks    Status Revised    Target Date 05/15/21      PT LONG TERM GOAL #2   Title The patient will be able to lift 3 lb object to overhead shelf.    Time 6    Period Weeks    Status On-going    Target Date 05/15/21      PT LONG TERM GOAL #3   Title The patient will reduce functional limitation from FOTO from 75% to < or equal to 35%.    Baseline 39% limitation on 02/28/21    Time 6    Period Weeks    Status Revised    Target Date 05/15/21      PT LONG TERM GOAL #4   Title The patient will improve L shoulder A/ROM flexion to> 150 degrees and ER to > 50 degrees.    Time 6    Period Weeks    Status Revised    Target Date 05/15/21      PT LONG TERM GOAL #5   Title The patient will demonstrate dec'd compensatory movements of shoulder shrug and thoracic extension during overhead motion.    Time 6    Period Weeks    Status Revised    Target Date 05/15/21           UPDATED LONG TERM  GOALS:        Plan - 04/03/21 1343    Clinical Impression Statement The patient returns to PT after an almost 4 week break to reduce focus on HEP.  She has slight decrease in overall ROM, but is using the L arm in daily tasks.  PT  to focus on areas she notes are challenging including functional strength and continued tightness.   Continue prior goals as these are still appropriate.    Rehab Potential Good    PT Frequency 1x / week    PT Duration 6 weeks    PT Treatment/Interventions ADLs/Self Care Home Management;Patient/family education;Therapeutic exercise;Therapeutic activities;Taping;Manual techniques;Dry needling;Vasopneumatic Device;Electrical Stimulation;Cryotherapy;Moist Heat    PT Next Visit Plan UE strengthening, ROM, flexibility    PT Home Exercise Plan [id number]    Consulted and Agree with Plan of Care Patient           Patient will benefit from skilled therapeutic intervention in order to improve the following deficits and impairments:  Pain,Decreased range of motion,Decreased strength,Postural dysfunction,Impaired flexibility,Hypomobility,Increased edema,Increased fascial restricitons  Visit Diagnosis: Acute pain of left shoulder  Muscle weakness (generalized)  Localized edema     Problem List There are no problems to display for this patient.   Lillan Mccreadie 04/03/2021, 1:53 PM  [facility] [place], [place], [address] Phone: [phone]   Fax:  [phone]  Name: Julia Walter MRN: [id number] Date of Birth: 07/11/1976

## 2021-04-10 ENCOUNTER — Encounter: Payer: Self-pay | Admitting: Rehabilitative and Restorative Service Providers"

## 2021-04-10 ENCOUNTER — Other Ambulatory Visit: Payer: Self-pay

## 2021-04-10 ENCOUNTER — Ambulatory Visit (INDEPENDENT_AMBULATORY_CARE_PROVIDER_SITE_OTHER): Payer: 59 | Admitting: Rehabilitative and Restorative Service Providers"

## 2021-04-10 DIAGNOSIS — M25512 Pain in left shoulder: Secondary | ICD-10-CM | POA: Diagnosis not present

## 2021-04-10 DIAGNOSIS — M6281 Muscle weakness (generalized): Secondary | ICD-10-CM | POA: Diagnosis not present

## 2021-04-10 NOTE — Therapy (Signed)
[facility] [address] [place], [place], [address] Phone: [phone]   Fax:  [phone]  Physical Therapy Treatment  Patient Details  Name: Julia Walter MRN: [id number] Date of Birth: 1976/04/06 Referring Provider (PT): Cammy Copa, MD   Encounter Date: 04/10/2021   PT End of Session - 04/10/21 1347    Visit Number 23    Number of Visits 30    Date for PT Re-Evaluation 05/15/21    Authorization - Visit Number 20    Authorization - Number of Visits 25    PT Start Time 1347    PT Stop Time 1430    PT Time Calculation (min) 43 min    Activity Tolerance Patient tolerated treatment well    Behavior During Therapy [facility] for tasks assessed/performed           History reviewed. No pertinent past medical history.  History reviewed. No pertinent surgical history.  There were no vitals filed for this visit.   Subjective Assessment - 04/10/21 1347    Subjective Exercises are going well.  The patient reports she is sore from quilting this morning.    Patient Stated Goals "I would like to have a real life again."    Currently in Pain? Yes    Pain Score 3     Pain Location Shoulder    Pain Orientation Left    Pain Descriptors / Indicators Tightness    Pain Onset More than a month ago    Pain Frequency Intermittent    Aggravating Factors  exercise/stretching, quilting    Pain Relieving Factors DN              [facility] PT Assessment - 04/10/21 1350      Assessment   Medical Diagnosis Labral Repair L shoulder    Referring Provider (PT) Cammy Copa, MD    Onset Date/Surgical Date 10/24/20    Hand Dominance Right                         [facility] Adult PT Treatment/Exercise - 04/10/21 1350      Exercises   Exercises Shoulder      Shoulder Exercises: Supine   External Rotation AROM;Left;10 reps    Flexion PROM;AROM;Left;5 reps    ABduction PROM;AROM;Left;5 reps      Shoulder  Exercises: Standing   Flexion Strengthening;Left;10 reps    Shoulder Flexion Weight (lbs) 3    Flexion Limitations to shoulder height    Other Standing Exercises lifting arm overhead to high shelf with 3 lb weight x 8 reps      Shoulder Exercises: ROM/Strengthening   UBE (Upper Arm Bike) L4 x 2 minutes forward/1 minute backward    Ball on Wall overhead with yellow, weighted ball      Shoulder Exercises: Body Blade   ABduction 45 seconds;1 rep    Other Body Blade Exercises overhead with bilat UEs x 60 seconds      Manual Therapy   Manual Therapy Soft tissue mobilization;Joint mobilization    Manual therapy comments skilled palpation to assess response to dry needling    Joint Mobilization grade II-III inferior GH mob    Soft tissue mobilization STM infraspinatus, biceps and deltoid, triceps, infraspinatus, upper trap            Trigger Point Dry Needling - 04/10/21 1431    Consent Given? Yes    Education Handout Provided Previously provided  Muscles Treated Head and Neck Upper trapezius;Levator scapulae    Muscles Treated Upper Quadrant Deltoid;Biceps;Infraspinatus;Triceps;Latissimus dorsi    Upper Trapezius Response Twitch reponse elicited;Palpable increased muscle length    Levator Scapulae Response Twitch response elicited;Palpable increased muscle length    Infraspinatus Response Twitch response elicited;Palpable increased muscle length    Deltoid Response Palpable increased muscle length    Latissimus dorsi Response Palpable increased muscle length    Biceps Response Palpable increased muscle length    Triceps Response Palpable increased muscle length                PT Long Term Goals - 04/10/21 1435      PT LONG TERM GOAL #1   Title The patient will be indep with HEP progression.    Time 6    Period Weeks    Status Revised      PT LONG TERM GOAL #2   Title The patient will be able to lift 3 lb object to overhead shelf.    Time 6    Period Weeks     Status Achieved      PT LONG TERM GOAL #3   Title The patient will reduce functional limitation from FOTO from 75% to < or equal to 35%.    Baseline 39% limitation on 02/28/21    Time 6    Period Weeks    Status Revised      PT LONG TERM GOAL #4   Title The patient will improve L shoulder A/ROM flexion to> 150 degrees and ER to > 50 degrees.    Time 6    Period Weeks    Status On-going      PT LONG TERM GOAL #5   Title The patient will demonstrate dec'd compensatory movements of shoulder shrug and thoracic extension during overhead motion.    Time 6    Period Weeks    Status Achieved                 Plan - 04/10/21 1434    Clinical Impression Statement The patient is progressing strengthening with HEP and continuing to gain with ROM.  Her biggest limitation is in ER and avoiding trunk rotation to the left.  Although she feels significant improvement with DN, she does not have lasting results and typically feels this is helpful each session.  PT to focus more on high amplitude and end range joint mobilization to gain remaining motion.    PT Treatment/Interventions ADLs/Self Care Home Management;Patient/family education;Therapeutic exercise;Therapeutic activities;Taping;Manual techniques;Dry needling;Vasopneumatic Device;Electrical Stimulation;Cryotherapy;Moist Heat    PT Next Visit Plan UE strengthening, ROM, flexibility    PT Home Exercise Plan [id number]    Consulted and Agree with Plan of Care Patient           Patient will benefit from skilled therapeutic intervention in order to improve the following deficits and impairments:     Visit Diagnosis: Acute pain of left shoulder  Muscle weakness (generalized)     Problem List There are no problems to display for this patient.   [name], PT 04/10/2021, 4:44 PM  [facility] [address] [place], [place], [address] Phone: [phone]   Fax:   [phone]  Name: Julia Walter MRN: [id number] Date of Birth: March 04, 1976

## 2021-04-17 ENCOUNTER — Other Ambulatory Visit: Payer: Self-pay | Admitting: Family Medicine

## 2021-04-17 ENCOUNTER — Other Ambulatory Visit (HOSPITAL_COMMUNITY): Payer: Self-pay

## 2021-04-17 MED FILL — Ibuprofen Tab 600 MG: ORAL | 30 days supply | Qty: 90 | Fill #0 | Status: AC

## 2021-04-17 MED FILL — Baclofen Tab 10 MG: ORAL | 10 days supply | Qty: 30 | Fill #1 | Status: AC

## 2021-04-18 ENCOUNTER — Other Ambulatory Visit (HOSPITAL_COMMUNITY): Payer: Self-pay

## 2021-04-18 MED ORDER — TRAMADOL HCL 50 MG PO TABS
25.0000 mg | ORAL_TABLET | Freq: Three times a day (TID) | ORAL | 0 refills | Status: DC | PRN
  Filled 2021-04-18: qty 30, 10d supply, fill #0

## 2021-04-24 ENCOUNTER — Ambulatory Visit (INDEPENDENT_AMBULATORY_CARE_PROVIDER_SITE_OTHER): Payer: 59 | Admitting: Rehabilitative and Restorative Service Providers"

## 2021-04-24 ENCOUNTER — Other Ambulatory Visit: Payer: Self-pay

## 2021-04-24 DIAGNOSIS — R600 Localized edema: Secondary | ICD-10-CM | POA: Diagnosis not present

## 2021-04-24 DIAGNOSIS — M25512 Pain in left shoulder: Secondary | ICD-10-CM | POA: Diagnosis not present

## 2021-04-24 DIAGNOSIS — M6281 Muscle weakness (generalized): Secondary | ICD-10-CM | POA: Diagnosis not present

## 2021-04-24 NOTE — Therapy (Signed)
[place], [place], [address] Phone: [phone]   Fax:  [phone]  Physical Therapy Treatment and Discharge Summary  Patient Details  Name: Julia Walter MRN: [id number] Date of Birth: 1976/11/09 Referring Provider (PT): Meredith Pel, MD  PHYSICAL THERAPY DISCHARGE SUMMARY  Visits from Start of Care: 24  Current functional level related to goals / functional outcomes: See goals below   Remaining deficits: End range limitations for ER, horizontal abduction, and compensatory movements with scapula and thoracic extension.   Education / Equipment: HEP  Plan: Patient agrees to discharge.  Patient goals were partially met. Patient is being discharged due to meeting the stated rehab goals.  ?????         Thank you for the referral of this patient. Rudell Cobb, MPT  Encounter Date: 04/24/2021   PT End of Session - 04/24/21 0939    Visit Number 24    Number of Visits 30    Date for PT Re-Evaluation 05/15/21    Authorization - Visit Number 21    Authorization - Number of Visits 25    PT Start Time [id number]    PT Stop Time 1020    PT Time Calculation (min) 43 min    Activity Tolerance Patient tolerated treatment well    Behavior During Therapy [facility] for tasks assessed/performed           No past medical history on file.  No past surgical history on file.  There were no vitals filed for this visit.   Subjective Assessment - 04/24/21 0938    Subjective The patient feels tight today.    Patient Stated Goals "I would like to have a real life again."    Currently in Pain? Yes    Pain Location Shoulder    Pain Descriptors / Indicators Tightness              [facility] PT Assessment - 04/24/21 0958      Assessment   Medical Diagnosis Labral Repair L shoulder    Referring Provider (PT) Meredith Pel, MD    Onset Date/Surgical Date 10/24/20      AROM   Left Shoulder  Flexion 160 Degrees    Left Shoulder ABduction --   last measurement to 150   Left Shoulder Internal Rotation --   thumb to lateral border of R scapula (L IR)   Left Shoulder External Rotation 45 Degrees   tight today-- 55 degrees with P/ROM overpressure   Left Shoulder Horizontal ABduction --   cannot reach behind frontal plane                        [facility] Adult PT Treatment/Exercise - 04/24/21 1343      Exercises   Exercises Shoulder      Shoulder Exercises: Prone   Retraction Strengthening;Left;10 reps    Flexion Strengthening    Flexion Limitations with muscle guarding/pain that starts after 5 reps    External Rotation Strengthening;Left;5 reps    External Rotation Limitations with arm supported by PT    Horizontal ABduction 1 Strengthening;Left;5 reps      Shoulder Exercises: Standing   Other Standing Exercises Overhead flexion stretch for end range, ER with arms abducted to 90 (bent elbows), mirror for cues to look at scapular position      Shoulder Exercises: ROM/Strengthening   UBE (Upper Arm Bike) L4 x 2 minutes forward/1 minute backwards  Shoulder Exercises: IT sales professional Limitations door frame stretch W and V      Manual Therapy   Manual Therapy Soft tissue mobilization;Joint mobilization;Myofascial release    Manual therapy comments to improve mobility    Joint Mobilization grade II-III inferior GH mob, joint distraction,    Soft tissue mobilization STM deltoid and parascapular musculature    Myofascial Release pec                    PT Short Term Goals - 12/23/20 1030      PT SHORT TERM GOAL #1   Title The patient will return demo HEP for AAROM to 90 degrees and isometric strengthening.    Time 4    Period Weeks    Status Achieved    Target Date 12/23/20      PT SHORT TERM GOAL #2   Title The patient will report no pain at rest.    Baseline 5/10 baseline; Update 4/10 with tightness    Time 6    Period Weeks     Status Not Met    Target Date 12/23/20      PT SHORT TERM GOAL #3   Title The patient will tolerate 90 degrees AAROM without shoulder pain.    Time 6    Period Weeks    Status Achieved    Target Date 12/23/20             PT Long Term Goals - 04/24/21 0956      PT LONG TERM GOAL #1   Title The patient will be indep with HEP progression.    Time 6    Period Weeks    Status Revised      PT LONG TERM GOAL #2   Title The patient will be able to lift 3 lb object to overhead shelf.    Time 6    Period Weeks    Status Achieved      PT LONG TERM GOAL #3   Title The patient will reduce functional limitation from FOTO from 75% to < or equal to 35%.    Baseline 39% limitation on 02/28/21    Time 6    Period Weeks    Status Not Met      PT LONG TERM GOAL #4   Title The patient will improve L shoulder A/ROM flexion to> 150 degrees and ER to > 50 degrees.    Baseline 160 degrees flexion and 45 degrees--* has been up to 55 degrees A/ROM, but tight today    Time 6    Period Weeks    Status Partially Met      PT LONG TERM GOAL #5   Title The patient will demonstrate dec'd compensatory movements of shoulder shrug and thoracic extension during overhead motion.    Time 6    Period Weeks    Status Achieved                 Plan - 04/24/21 1339    Clinical Impression Statement The patient has partially met LTGs. PT and patient discussed return to community exercise like yogs and swimming.  Patient continues with days of tightness where range is more limited.  Overall, she is using her L UE for overhead reaching with end range limitations.  She also continues with compensatory thoracic extension and scapular retraction to compensate for dec'd end range.    PT Next Visit Plan Discharge today    PT  Home Exercise Plan [id number]    Consulted and Agree with Plan of Care Patient           Patient will benefit from skilled therapeutic intervention in order to improve the following  deficits and impairments:     Visit Diagnosis: Acute pain of left shoulder  Muscle weakness (generalized)  Localized edema     Problem List There are no problems to display for this patient.   [place], [place] 04/24/2021, 1:46 PM  [facility] [place], [place], [address] Phone: [phone]   Fax:  [phone]  Name: Julia Walter MRN: [id number] Date of Birth: June 27, 1976

## 2021-05-12 ENCOUNTER — Ambulatory Visit: Payer: 59 | Admitting: Orthopedic Surgery

## 2021-05-16 ENCOUNTER — Other Ambulatory Visit (HOSPITAL_COMMUNITY): Payer: Self-pay

## 2021-05-16 ENCOUNTER — Other Ambulatory Visit: Payer: Self-pay | Admitting: Family Medicine

## 2021-05-16 MED ORDER — TRAMADOL HCL 50 MG PO TABS
25.0000 mg | ORAL_TABLET | Freq: Three times a day (TID) | ORAL | 0 refills | Status: DC | PRN
Start: 2021-05-16 — End: 2021-06-13
  Filled 2021-05-16: qty 30, 10d supply, fill #0

## 2021-05-16 MED FILL — Baclofen Tab 10 MG: ORAL | 10 days supply | Qty: 30 | Fill #2 | Status: AC

## 2021-05-17 ENCOUNTER — Ambulatory Visit: Payer: 59 | Admitting: Orthopedic Surgery

## 2021-05-17 DIAGNOSIS — M7502 Adhesive capsulitis of left shoulder: Secondary | ICD-10-CM

## 2021-05-19 ENCOUNTER — Encounter: Payer: Self-pay | Admitting: Orthopedic Surgery

## 2021-05-19 NOTE — Progress Notes (Signed)
   Office Visit Note   Patient: Julia Walter           Date of Birth: 02/27/1976           MRN: [id number] Visit Date: 05/17/2021 Requested by: No referring provider defined for this encounter. PCP: Patient, No Pcp Per (Inactive)  Subjective: Chief Complaint  Patient presents with   Left Shoulder - Follow-up    HPI: Julia Walter is a 45 year old patient with left shoulder adhesive capsulitis after her labral repair.  She had 1 shot after surgery which helped.  She has been doing a lot of intense work on her own as well as in therapy to try to get some motion back.  Reports some occasional mechanical symptoms in the shoulder as well as biceps pain.  She describes good functionality and improved range of motion but would like for it to be closer to the normal right side.  Going behind her back is still difficult.              ROS: All systems reviewed are negative as they relate to the chief complaint within the history of present illness.  Patient denies  fevers or chills.   Assessment & Plan: Visit Diagnoses:  1. Adhesive capsulitis of left shoulder     Plan: Impression is left shoulder adhesive capsulitis following labral repair with improvement in motions but still with some residual functional limitations and some pain.  I think she is going to continue to work on range of motion exercises at home.  No indication for further work-up or intervention.  Follow-up as needed.  Follow-Up Instructions: Return if symptoms worsen or fail to improve.   Orders:  No orders of the defined types were placed in this encounter.  No orders of the defined types were placed in this encounter.     Procedures: No procedures performed   Clinical Data: No additional findings.  Objective: Vital Signs: There were no vitals taken for this visit.  Physical Exam:   Constitutional: Patient appears well-developed HEENT:  Head: Normocephalic Eyes:EOM are normal Neck: Normal range of  motion Cardiovascular: Normal rate Pulmonary/chest: Effort normal Neurologic: Patient is alert Skin: Skin is warm Psychiatric: Patient has normal mood and affect   Ortho Exam: Ortho exam demonstrates improved range of motion on that left-hand side passively to 45/90/160.  Rotator cuff strength is good.  Not too much in the way of grinding or clicking in the shoulder with AB duction and internal/external rotation at 15 and 90 degrees of abduction.  Specialty Comments:  No specialty comments available.  Imaging: No results found.   PMFS History: There are no problems to display for this patient.  History reviewed. No pertinent past medical history.  Family History  Problem Relation Age of Onset   Cancer Mother    Diabetes Mother    Heart failure Father    Diabetes Father     History reviewed. No pertinent surgical history. Social History   Occupational History   Not on file  Tobacco Use   Smoking status: Every Day    Packs/day: 1.00    Pack years: 0.00    Types: Cigarettes   Smokeless tobacco: Never  Vaping Use   Vaping Use: Never used  Substance and Sexual Activity   Alcohol use: Never   Drug use: Never   Sexual activity: Yes

## 2021-06-13 ENCOUNTER — Other Ambulatory Visit (HOSPITAL_COMMUNITY): Payer: Self-pay

## 2021-06-13 ENCOUNTER — Other Ambulatory Visit: Payer: Self-pay | Admitting: Family Medicine

## 2021-06-13 MED ORDER — TRAMADOL HCL 50 MG PO TABS
25.0000 mg | ORAL_TABLET | Freq: Three times a day (TID) | ORAL | 0 refills | Status: DC | PRN
  Filled 2021-06-13: qty 30, 10d supply, fill #0

## 2021-06-13 MED FILL — Baclofen Tab 10 MG: ORAL | 10 days supply | Qty: 30 | Fill #3 | Status: AC

## 2021-06-13 MED FILL — Ibuprofen Tab 600 MG: ORAL | 30 days supply | Qty: 90 | Fill #1 | Status: AC

## 2021-07-11 ENCOUNTER — Other Ambulatory Visit (HOSPITAL_COMMUNITY): Payer: Self-pay

## 2021-07-11 ENCOUNTER — Other Ambulatory Visit: Payer: Self-pay | Admitting: Family Medicine

## 2021-07-11 MED FILL — Baclofen Tab 10 MG: ORAL | 10 days supply | Qty: 30 | Fill #4 | Status: AC

## 2021-07-12 ENCOUNTER — Other Ambulatory Visit (HOSPITAL_BASED_OUTPATIENT_CLINIC_OR_DEPARTMENT_OTHER): Payer: Self-pay

## 2021-07-12 ENCOUNTER — Encounter: Payer: Self-pay | Admitting: Family Medicine

## 2021-07-12 ENCOUNTER — Other Ambulatory Visit (HOSPITAL_COMMUNITY): Payer: Self-pay

## 2021-07-12 MED ORDER — TRAMADOL HCL 50 MG PO TABS
25.0000 mg | ORAL_TABLET | Freq: Three times a day (TID) | ORAL | 0 refills | Status: DC | PRN
  Filled 2021-07-12: qty 30, 10d supply, fill #0

## 2021-07-12 MED FILL — Baclofen Tab 10 MG: ORAL | 10 days supply | Qty: 30 | Fill #0 | Status: CN

## 2021-07-12 MED FILL — Ondansetron HCl Tab 4 MG: ORAL | 30 days supply | Qty: 30 | Fill #0 | Status: AC

## 2021-07-12 MED FILL — Diclofenac Sodium Tab Delayed Release 75 MG: ORAL | 30 days supply | Qty: 60 | Fill #0 | Status: AC

## 2021-07-12 MED FILL — Ibuprofen Tab 600 MG: ORAL | 30 days supply | Qty: 90 | Fill #0 | Status: AC

## 2021-08-09 ENCOUNTER — Other Ambulatory Visit (HOSPITAL_BASED_OUTPATIENT_CLINIC_OR_DEPARTMENT_OTHER): Payer: Self-pay

## 2021-08-09 ENCOUNTER — Other Ambulatory Visit: Payer: Self-pay

## 2021-08-09 MED FILL — Baclofen Tab 10 MG: ORAL | 10 days supply | Qty: 30 | Fill #0 | Status: AC

## 2021-08-10 ENCOUNTER — Other Ambulatory Visit (HOSPITAL_BASED_OUTPATIENT_CLINIC_OR_DEPARTMENT_OTHER): Payer: Self-pay

## 2021-08-10 ENCOUNTER — Other Ambulatory Visit: Payer: Self-pay | Admitting: Family Medicine

## 2021-08-11 ENCOUNTER — Other Ambulatory Visit: Payer: Self-pay | Admitting: Surgical

## 2021-08-11 ENCOUNTER — Telehealth: Payer: Self-pay | Admitting: Orthopedic Surgery

## 2021-08-11 ENCOUNTER — Other Ambulatory Visit (HOSPITAL_BASED_OUTPATIENT_CLINIC_OR_DEPARTMENT_OTHER): Payer: Self-pay

## 2021-08-11 MED ORDER — TRAMADOL HCL 50 MG PO TABS
25.0000 mg | ORAL_TABLET | Freq: Two times a day (BID) | ORAL | 0 refills | Status: DC | PRN
  Filled 2021-08-11: qty 30, 15d supply, fill #0

## 2021-08-11 NOTE — Telephone Encounter (Signed)
I called and advised that this had been sent in to her pharmacy

## 2021-08-11 NOTE — Telephone Encounter (Signed)
Sent in rx.

## 2021-08-11 NOTE — Telephone Encounter (Signed)
Patient called. She would like a refill on Tramadol sent to [facility] 

## 2021-08-16 ENCOUNTER — Other Ambulatory Visit (HOSPITAL_BASED_OUTPATIENT_CLINIC_OR_DEPARTMENT_OTHER): Payer: Self-pay

## 2021-09-04 ENCOUNTER — Other Ambulatory Visit (HOSPITAL_BASED_OUTPATIENT_CLINIC_OR_DEPARTMENT_OTHER): Payer: Self-pay

## 2021-09-04 ENCOUNTER — Other Ambulatory Visit: Payer: Self-pay | Admitting: Surgical

## 2021-09-04 ENCOUNTER — Telehealth: Payer: Self-pay | Admitting: Orthopedic Surgery

## 2021-09-04 MED FILL — Baclofen Tab 10 MG: ORAL | 10 days supply | Qty: 30 | Fill #1 | Status: AC

## 2021-09-04 NOTE — Telephone Encounter (Signed)
Refusing RX. Recommend OTC Tylenol and Motrin or something similar. Can RX prescription NSAID if she would like that but no opioid medication

## 2021-09-04 NOTE — Telephone Encounter (Signed)
Patient called. She would like a refill on Tramadol sent to [facility]

## 2021-09-04 NOTE — Telephone Encounter (Signed)
At this point, recommend combination of tylenol and NSAID (whether OTC or prescription).  Too far out to be refilling opioid medication constantly, even if just tramadol

## 2021-09-05 ENCOUNTER — Other Ambulatory Visit: Payer: Self-pay | Admitting: Surgical

## 2021-09-05 ENCOUNTER — Other Ambulatory Visit (HOSPITAL_COMMUNITY): Payer: Self-pay

## 2021-09-05 MED ORDER — TRAMADOL HCL 50 MG PO TABS
25.0000 mg | ORAL_TABLET | Freq: Two times a day (BID) | ORAL | 0 refills | Status: DC | PRN
  Filled 2021-09-05: qty 30, 15d supply, fill #0
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
## 2021-09-06 ENCOUNTER — Other Ambulatory Visit (HOSPITAL_BASED_OUTPATIENT_CLINIC_OR_DEPARTMENT_OTHER): Payer: Self-pay

## 2021-09-06 NOTE — Telephone Encounter (Signed)
Tried calling patient. No answer. LMVM advising per below.

## 2021-09-14 ENCOUNTER — Encounter: Payer: Self-pay | Admitting: Orthopedic Surgery

## 2021-09-29 ENCOUNTER — Ambulatory Visit: Payer: Self-pay

## 2021-09-29 ENCOUNTER — Encounter: Payer: Self-pay | Admitting: Surgical

## 2021-09-29 ENCOUNTER — Other Ambulatory Visit: Payer: Self-pay

## 2021-09-29 ENCOUNTER — Ambulatory Visit: Payer: 59 | Admitting: Surgical

## 2021-09-29 DIAGNOSIS — R102 Pelvic and perineal pain: Secondary | ICD-10-CM | POA: Diagnosis not present

## 2021-09-29 DIAGNOSIS — M546 Pain in thoracic spine: Secondary | ICD-10-CM | POA: Diagnosis not present

## 2021-09-29 DIAGNOSIS — M542 Cervicalgia: Secondary | ICD-10-CM | POA: Diagnosis not present

## 2021-09-29 DIAGNOSIS — M545 Low back pain, unspecified: Secondary | ICD-10-CM | POA: Diagnosis not present

## 2021-10-01 ENCOUNTER — Encounter: Payer: Self-pay | Admitting: Surgical
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
## 2021-10-03 ENCOUNTER — Telehealth: Payer: Self-pay

## 2021-10-03 ENCOUNTER — Other Ambulatory Visit: Payer: Self-pay

## 2021-10-03 DIAGNOSIS — R102 Pelvic and perineal pain: Secondary | ICD-10-CM

## 2021-10-03 NOTE — Telephone Encounter (Signed)
Patient was previously patient of Dr Prince Rome and is now needing to re-establish primary care for herself since Dr Prince Rome has left the practice. 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 Can you advise who we could potentially refer her to and I can call her and ask if she has a preference?

## 2021-10-03 NOTE — Telephone Encounter (Signed)
She can go to [facility] with Dr. de [place], she lives in [place] she can also try [facility] [place] too Dr. Beverely Low. Dr. Prince Rome recommendation was de [place].

## 2021-10-03 NOTE — Addendum Note (Signed)
Addended byPrescott Parma on: 10/03/2021 02:27 PM   Modules accepted: Orders

## 2021-10-03 NOTE — Telephone Encounter (Signed)
Julia Walter would like for patient to have an MRI but wanting it to eval SI joints and posterior pelvic pain. Could you please check with [facility] to see what would be best to order so we can evaluate both areas. Thanks.

## 2021-10-04 ENCOUNTER — Other Ambulatory Visit: Payer: Self-pay | Admitting: Surgical

## 2021-10-04 MED ORDER — BACLOFEN 10 MG PO TABS
ORAL_TABLET | ORAL | 6 refills | Status: AC
  Filled 2021-10-04: qty 30, 10d supply, fill #0
  Filled 2021-10-31: qty 30, 10d supply, fill #1
  Filled 2021-11-26: qty 30, 10d supply, fill #2
  Filled 2022-01-02: qty 30, 10d supply, fill #3
  Filled 2022-01-30: qty 30, 10d supply, fill #4
  Filled 2022-02-27: qty 30, 10d supply, fill #5

## 2021-10-04 MED ORDER — TRAMADOL HCL 50 MG PO TABS
25.0000 mg | ORAL_TABLET | Freq: Two times a day (BID) | ORAL | 0 refills | Status: DC | PRN
  Filled 2021-10-04: qty 30, 15d supply, fill #0

## 2021-10-04 MED ORDER — DIAZEPAM 2 MG PO TABS
2.0000 mg | ORAL_TABLET | Freq: Once | ORAL | 0 refills | Status: AC
  Filled 2021-10-04: qty 2, 2d supply, fill #0

## 2021-10-05 ENCOUNTER — Other Ambulatory Visit (HOSPITAL_BASED_OUTPATIENT_CLINIC_OR_DEPARTMENT_OTHER): Payer: Self-pay

## 2021-10-10 NOTE — Telephone Encounter (Signed)
Let's go with Dr. De Peru if Dr. Prince Rome recommends them.  thanks

## 2021-10-13 ENCOUNTER — Other Ambulatory Visit: Payer: Self-pay

## 2021-10-13 DIAGNOSIS — Z0000 Encounter for general adult medical examination without abnormal findings: Secondary | ICD-10-CM

## 2021-10-30 ENCOUNTER — Ambulatory Visit
Admission: RE | Admit: 2021-10-30 | Discharge: 2021-10-30 | Disposition: A | Discharge status: Home / Self Care | Payer: 59 | Source: Ambulatory Visit | Attending: Surgical | Admitting: Surgical

## 2021-10-30 ENCOUNTER — Other Ambulatory Visit: Payer: Self-pay

## 2021-10-30 DIAGNOSIS — R102 Pelvic and perineal pain: Secondary | ICD-10-CM

## 2021-10-30 MED ORDER — GADOBENATE DIMEGLUMINE 529 MG/ML IV SOLN
13.0000 mL | Freq: Once | INTRAVENOUS | Status: AC | PRN
  Administered 2021-10-30: 13 mL via INTRAVENOUS

## 2021-10-31 ENCOUNTER — Other Ambulatory Visit (HOSPITAL_BASED_OUTPATIENT_CLINIC_OR_DEPARTMENT_OTHER): Payer: Self-pay

## 2021-10-31 ENCOUNTER — Other Ambulatory Visit: Payer: Self-pay | Admitting: Surgical

## 2021-11-01 ENCOUNTER — Other Ambulatory Visit (HOSPITAL_BASED_OUTPATIENT_CLINIC_OR_DEPARTMENT_OTHER): Payer: Self-pay

## 2021-11-01 MED ORDER — TRAMADOL HCL 50 MG PO TABS
25.0000 mg | ORAL_TABLET | Freq: Two times a day (BID) | ORAL | 0 refills | Status: DC | PRN
Start: 2021-11-01 — End: 2021-11-26
  Filled 2021-11-01: qty 30, 15d supply, fill #0

## 2021-11-06 ENCOUNTER — Other Ambulatory Visit: Payer: Self-pay

## 2021-11-06 ENCOUNTER — Ambulatory Visit: Payer: 59 | Admitting: Orthopedic Surgery

## 2021-11-06 DIAGNOSIS — M545 Low back pain, unspecified: Secondary | ICD-10-CM

## 2021-11-07 ENCOUNTER — Encounter: Payer: Self-pay | Admitting: Orthopedic Surgery

## 2021-11-07 NOTE — Progress Notes (Signed)
   Office Visit Note   Patient: Julia Walter           Date of Birth: 1976/05/11           MRN: [id number] Visit Date: 11/06/2021 Requested by: No referring provider defined for this encounter. PCP: Patient, No Pcp Per (Inactive)  Subjective: Chief Complaint  Patient presents with   Other    Scan review    HPI: Julia Walter is a patient with low back pain.  Having a lot of posterior back pain around the SI joint.  Since she was last seen she has had an MRI scan of the sacrum which was normal.  She had some left-sided bursitis type pain which has resolved.  The MRI scan does show some facet joint edema and arthritis in the lower lumbar facet regions.  She is doing a home exercise program.  She has a history of doing a lot of physical therapy.              ROS: All systems reviewed are negative as they relate to the chief complaint within the history of present illness.  Patient denies  fevers or chills.   Assessment & Plan: Visit Diagnoses:  1. Low back pain, unspecified back pain laterality, unspecified chronicity, unspecified whether sciatica present     Plan: Impression is low back pain with normal sacrum MRI.  No stress fracture.  SI joints appear unremarkable.  Plan is lumbar spine MRI with follow-up with Dr. Alvester Morin to evaluate for epidural steroid injections versus facet injections versus SI joint injection.  Follow-Up Instructions: No follow-ups on file.   Orders:  Orders Placed This Encounter  Procedures   MR Lumbar Spine w/o contrast   Ambulatory referral to Physical Medicine Rehab   No orders of the defined types were placed in this encounter.     Procedures: No procedures performed   Clinical Data: No additional findings.  Objective: Vital Signs: There were no vitals taken for this visit.  Physical Exam:   Constitutional: Patient appears well-developed HEENT:  Head: Normocephalic Eyes:EOM are normal Neck: Normal range of motion Cardiovascular: Normal  rate Pulmonary/chest: Effort normal Neurologic: Patient is alert Skin: Skin is warm Psychiatric: Patient has normal mood and affect   Ortho Exam: Ortho exam demonstrates mild tenderness around the SI joints bilaterally.  Some pain with forward lateral bending.  Not as much trochanteric tenderness today.  No groin pain with internal ex rotation of the leg.  No definite nerve root tension signs.  Negative clonus bilaterally.  Pedal pulses palpable.  Gait is normal.  Specialty Comments:  No specialty comments available.  Imaging: No results found.   PMFS History: There are no problems to display for this patient.  No past medical history on file.  Family History  Problem Relation Age of Onset   Cancer Mother    Diabetes Mother    Heart failure Father    Diabetes Father     No past surgical history on file. Social History   Occupational History   Not on file  Tobacco Use   Smoking status: Every Day    Packs/day: 1.00    Types: Cigarettes   Smokeless tobacco: Never  Vaping Use   Vaping Use: Never used  Substance and Sexual Activity   Alcohol use: Never   Drug use: Never   Sexual activity: Yes

## 2021-11-23 ENCOUNTER — Telehealth: Payer: Self-pay | Admitting: Orthopedic Surgery

## 2021-11-23 ENCOUNTER — Other Ambulatory Visit: Payer: Self-pay | Admitting: Surgical

## 2021-11-23 ENCOUNTER — Other Ambulatory Visit (HOSPITAL_BASED_OUTPATIENT_CLINIC_OR_DEPARTMENT_OTHER): Payer: Self-pay

## 2021-11-23 MED ORDER — DIAZEPAM 2 MG PO TABS
2.0000 mg | ORAL_TABLET | Freq: Once | ORAL | 0 refills | Status: AC
  Filled 2021-11-23: qty 2, 2d supply, fill #0

## 2021-11-23 NOTE — Telephone Encounter (Signed)
Sent in RX for Tramadol

## 2021-11-23 NOTE — Telephone Encounter (Signed)
Just kidding, I meant Valium

## 2021-11-23 NOTE — Telephone Encounter (Signed)
Pt called and states that she gets very claustrophobic and would like to know if she can be prescribe something to help with that.   Cb [phone]

## 2021-11-24 NOTE — Telephone Encounter (Signed)
Tried calling to advise sent. No answer.  

## 2021-11-26 ENCOUNTER — Other Ambulatory Visit: Payer: Self-pay | Admitting: Surgical

## 2021-11-26 MED ORDER — TRAMADOL HCL 50 MG PO TABS
25.0000 mg | ORAL_TABLET | Freq: Two times a day (BID) | ORAL | 0 refills | Status: DC | PRN
  Filled 2021-11-26: qty 30, 15d supply, fill #0

## 2021-11-27 ENCOUNTER — Other Ambulatory Visit (HOSPITAL_BASED_OUTPATIENT_CLINIC_OR_DEPARTMENT_OTHER): Payer: Self-pay

## 2021-12-18 ENCOUNTER — Other Ambulatory Visit: Payer: Self-pay

## 2021-12-18 ENCOUNTER — Ambulatory Visit
Admission: RE | Admit: 2021-12-18 | Discharge: 2021-12-18 | Disposition: A | Discharge status: Home / Self Care | Payer: 59 | Source: Ambulatory Visit | Attending: Orthopedic Surgery | Admitting: Orthopedic Surgery

## 2021-12-18 DIAGNOSIS — M545 Low back pain, unspecified: Secondary | ICD-10-CM

## 2021-12-21 ENCOUNTER — Encounter: Payer: Self-pay | Admitting: Physical Medicine and Rehabilitation

## 2021-12-21 ENCOUNTER — Other Ambulatory Visit (HOSPITAL_BASED_OUTPATIENT_CLINIC_OR_DEPARTMENT_OTHER): Payer: Self-pay

## 2021-12-21 ENCOUNTER — Other Ambulatory Visit: Payer: Self-pay

## 2021-12-21 ENCOUNTER — Ambulatory Visit: Payer: 59 | Admitting: Physical Medicine and Rehabilitation

## 2021-12-21 VITALS — BP 120/62 | HR 121

## 2021-12-21 DIAGNOSIS — M4316 Spondylolisthesis, lumbar region: Secondary | ICD-10-CM

## 2021-12-21 DIAGNOSIS — M47816 Spondylosis without myelopathy or radiculopathy, lumbar region: Secondary | ICD-10-CM | POA: Diagnosis not present

## 2021-12-21 DIAGNOSIS — G8929 Other chronic pain: Secondary | ICD-10-CM | POA: Diagnosis not present

## 2021-12-21 DIAGNOSIS — M545 Low back pain, unspecified: Secondary | ICD-10-CM

## 2021-12-21 MED ORDER — DIAZEPAM 5 MG PO TABS
ORAL_TABLET | ORAL | 0 refills | Status: DC
  Filled 2021-12-21: qty 2, 1d supply, fill #0
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
## 2022-01-02 ENCOUNTER — Other Ambulatory Visit (HOSPITAL_BASED_OUTPATIENT_CLINIC_OR_DEPARTMENT_OTHER): Payer: Self-pay

## 2022-01-02 ENCOUNTER — Other Ambulatory Visit: Payer: Self-pay | Admitting: Surgical

## 2022-01-02 MED ORDER — TRAMADOL HCL 50 MG PO TABS
25.0000 mg | ORAL_TABLET | Freq: Two times a day (BID) | ORAL | 0 refills | Status: DC | PRN
Start: 2022-01-02 — End: 2022-01-30
  Filled 2022-01-02: qty 30, 15d supply, fill #0

## 2022-01-11 ENCOUNTER — Encounter: Payer: Self-pay | Admitting: Physical Medicine and Rehabilitation

## 2022-01-11 ENCOUNTER — Ambulatory Visit: Payer: Self-pay

## 2022-01-11 ENCOUNTER — Ambulatory Visit: Payer: 59 | Admitting: Physical Medicine and Rehabilitation

## 2022-01-11 ENCOUNTER — Other Ambulatory Visit: Payer: Self-pay

## 2022-01-11 VITALS — BP 107/73 | HR 111

## 2022-01-11 DIAGNOSIS — M47816 Spondylosis without myelopathy or radiculopathy, lumbar region: Secondary | ICD-10-CM | POA: Diagnosis not present

## 2022-01-11 MED ORDER — METHYLPREDNISOLONE ACETATE 80 MG/ML IJ SUSP: 80.0000 mg | Freq: Once | INTRAMUSCULAR | Status: DC

## 2022-01-11 NOTE — Progress Notes (Signed)
Pt state lower back pain that travels to her hips. Pt state walking, standing and lifting makes the pain worse. Pt state she takes pain meds to help ease her pain.  Numeric Pain Rating Scale and Functional Assessment Average Pain 7   In the last MONTH (on 0-10 scale) has pain interfered with the following?  1. General activity like being  able to carry out your everyday physical activities such as walking, climbing stairs, carrying groceries, or moving a chair?  Rating(9)   +Driver, -BT, -Dye Allergies.

## 2022-01-11 NOTE — Patient Instructions (Signed)

## 2022-01-15 ENCOUNTER — Encounter: Payer: Self-pay | Admitting: Physical Medicine and Rehabilitation

## 2022-01-20 NOTE — Progress Notes (Signed)
Julia Walter - 46 y.o. female MRN [id number]  Date of birth: [birth year]  Office Visit Note: Visit Date: 01/11/2022 PCP: Patient, No Pcp Per (Inactive) Referred by: Lorine Bears, NP  Subjective: Chief Complaint  Patient presents with   Lower Back - Pain   Right Hip - Pain   Right Leg - Pain   HPI:  Julia Walter is a 46 y.o. female who comes in today at the request of Dr. Anderson Malta and Barnet Pall, FNP for planned Bilateral  L4-5 Lumbar facet/medial branch block with fluoroscopic guidance.  The patient has failed conservative care including home exercise, medications, time and activity modification.  This injection will be diagnostic and hopefully therapeutic.  Please see requesting physician notes for further details and justification.  Exam has shown concordant pain with facet joint loading.   ROS Otherwise per HPI.  Assessment & Plan: Visit Diagnoses:    ICD-10-CM   1. Spondylosis without myelopathy or radiculopathy, lumbar region  M47.816 XR C-ARM NO REPORT    Facet Injection    DISCONTINUED: methylPREDNISolone acetate (DEPO-MEDROL) injection 80 mg      Plan: No additional findings.   Meds & Orders:  Meds ordered this encounter  Medications   DISCONTD: methylPREDNISolone acetate (DEPO-MEDROL) injection 80 mg    Orders Placed This Encounter  Procedures   Facet Injection   XR C-ARM NO REPORT    Follow-up: Return if symptoms worsen or fail to improve.   Procedures: No procedures performed  Lumbar Diagnostic Facet Joint Nerve Block with Fluoroscopic Guidance   Patient: Julia Walter      Date of Birth: 05/23/1976 MRN: [id number] PCP: Patient, No Pcp Per (Inactive)      Visit Date: 01/11/2022   Universal Protocol:    Date/Time: 02/11/231:10 PM  Consent Given By: the patient  Position: PRONE  Additional Comments: Vital signs were monitored before and after the procedure. Patient was prepped and draped in the usual sterile fashion. The  correct patient, procedure, and site was verified.   Injection Procedure Details:   Procedure diagnoses:  1. Spondylosis without myelopathy or radiculopathy, lumbar region      Meds Administered:  Meds ordered this encounter  Medications   methylPREDNISolone acetate (DEPO-MEDROL) injection 80 mg     Laterality: Bilateral  Location/Site: L4-L5, L3 and L4 medial branches  Needle: 5.0 in., 25 ga.  Short bevel or Quincke spinal needle  Needle Placement: Oblique pedical  Findings:   -Comments: There was excellent flow of contrast along the articular pillars without intravascular flow.  Procedure Details: The fluoroscope beam is vertically oriented in AP and then obliqued 15 to 20 degrees to the ipsilateral side of the desired nerve to achieve the Scotty dog appearance.  The skin over the target area of the junction of the superior articulating process and the transverse process (sacral ala if blocking the L5 dorsal rami) was locally anesthetized with a 1 ml volume of 1% Lidocaine without Epinephrine.  The spinal needle was inserted and advanced in a trajectory view down to the target.   After contact with periosteum and negative aspirate for blood and CSF, correct placement without intravascular or epidural spread was confirmed by injecting 0.5 ml. of Isovue-250.  A spot radiograph was obtained of this image.    Next, a 0.5 ml. volume of the injectate described above was injected. The needle was then redirected to the other facet joint nerves mentioned above if needed.  Prior to the procedure,  the patient was given a Pain Diary which was completed for baseline measurements.  After the procedure, the patient rated their pain every 30 minutes and will continue rating at this frequency for a total of 5 hours.  The patient has been asked to complete the Diary and return to [place] by mail, fax or hand delivered as soon as possible.   Additional Comments:  The patient tolerated the procedure  well Dressing: 2 x 2 sterile gauze and Band-Aid    Post-procedure details: Patient was observed during the procedure. Post-procedure instructions were reviewed.  Patient left the clinic in stable condition.    Clinical History: MRI LUMBAR SPINE WITHOUT CONTRAST   TECHNIQUE: Multiplanar, multisequence MR imaging of the lumbar spine was performed. No intravenous contrast was administered.   COMPARISON:  Radiography 09/29/2021   FINDINGS: Segmentation: L5 has some transitional features. Correlation with this numbering scheme would be important should intervention be contemplated.   Alignment:  1 or 2 mm of anterolisthesis L4-5 and L5-S1.   Vertebrae:  No fracture or focal bone lesion.   Conus medullaris and cauda equina: Conus extends to the L1 level. Conus and cauda equina appear normal.   Paraspinal and other soft tissues: Negative   Disc levels:   No abnormality at L3-4 or above.   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 Mild bulging of the disc towards the left. Mild narrowing of the left lateral recess and intervertebral foramen on the left but without definite neural compression   L5-S1: Somewhat transitional level. Mild bulging of the disc. Minimal facet hypertrophy. No compressive stenosis.   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   At L4-5, there is bilateral facet osteoarthritis with mild edema and 2 mm of anterolisthesis. There is bulging of the disc slightly more towards the left. Mild narrowing of the left lateral recess and intervertebral foramen on the left. Definite neural compression is not established, but left-sided neural irritation could possibly occur. Additionally, the facet arthritis could be a cause of back pain or referred facet syndrome pain.     Electronically Signed   By: Nelson Chimes M.D.   On: 12/18/2021 16:04     Objective:  VS:  HT:     WT:    BMI:      BP:107/73   HR:(!) 111bpm   TEMP: ( )    RESP:  Physical Exam Vitals and nursing note reviewed.  Constitutional:      General: She is not in acute distress.    Appearance: Normal appearance. She is not ill-appearing.  HENT:     Head: Normocephalic and atraumatic.     Right Ear: External ear normal.     Left Ear: External ear normal.  Eyes:     Extraocular Movements: Extraocular movements intact.  Cardiovascular:     Rate and Rhythm: Normal rate.     Pulses: Normal pulses.  Pulmonary:     Effort: Pulmonary effort is normal. No respiratory distress.  Abdominal:     General: There is no distension.     Palpations: Abdomen is soft.  Musculoskeletal:        General: Tenderness present.     Cervical back: Neck supple.     Right lower leg: No edema.     Left lower leg: No edema.     Comments: Patient has good distal strength with no pain over the greater trochanters.  No clonus or focal weakness. Patient somewhat slow to rise from a seated  position to full extension.  There is concordant low back pain with facet loading and lumbar spine extension rotation.  There are no definitive trigger points but the patient is somewhat tender across the lower back and PSIS.  There is no pain with hip rotation.   Skin:    Findings: No erythema, lesion or rash.  Neurological:     General: No focal deficit present.     Mental Status: She is alert and oriented to person, place, and time.     Sensory: No sensory deficit.     Motor: No weakness or abnormal muscle tone.     Coordination: Coordination normal.  Psychiatric:        Mood and Affect: Mood normal.        Behavior: Behavior normal.     Imaging: No results found.

## 2022-01-20 NOTE — Procedures (Signed)
Lumbar Diagnostic Facet Joint Nerve Block with Fluoroscopic Guidance   Patient: Julia Walter      Date of Birth: 14-Mar-1976 MRN: [id number] PCP: Patient, No Pcp Per (Inactive)      Visit Date: 01/11/2022   Universal Protocol:    Date/Time: 02/11/231:10 PM  Consent Given By: the patient  Position: PRONE  Additional Comments: Vital signs were monitored before and after the procedure. Patient was prepped and draped in the usual sterile fashion. The correct patient, procedure, and site was verified.   Injection Procedure Details:   Procedure diagnoses:  1. Spondylosis without myelopathy or radiculopathy, lumbar region      Meds Administered:  Meds ordered this encounter  Medications   methylPREDNISolone acetate (DEPO-MEDROL) injection 80 mg     Laterality: Bilateral  Location/Site: L4-L5, L3 and L4 medial branches  Needle: 5.0 in., 25 ga.  Short bevel or Quincke spinal needle  Needle Placement: Oblique pedical  Findings:   -Comments: There was excellent flow of contrast along the articular pillars without intravascular flow.  Procedure Details: The fluoroscope beam is vertically oriented in AP and then obliqued 15 to 20 degrees to the ipsilateral side of the desired nerve to achieve the Scotty dog appearance.  The skin over the target area of the junction of the superior articulating process and the transverse process (sacral ala if blocking the L5 dorsal rami) was locally anesthetized with a 1 ml volume of 1% Lidocaine without Epinephrine.  The spinal needle was inserted and advanced in a trajectory view down to the target.   After contact with periosteum and negative aspirate for blood and CSF, correct placement without intravascular or epidural spread was confirmed by injecting 0.5 ml. of Isovue-250.  A spot radiograph was obtained of this image.    Next, a 0.5 ml. volume of the injectate described above was injected. The needle was then redirected to the other  facet joint nerves mentioned above if needed.  Prior to the procedure, the patient was given a Pain Diary which was completed for baseline measurements.  After the procedure, the patient rated their pain every 30 minutes and will continue rating at this frequency for a total of 5 hours.  The patient has been asked to complete the Diary and return to [place] by mail, fax or hand delivered as soon as possible.   Additional Comments:  The patient tolerated the procedure well Dressing: 2 x 2 sterile gauze and Band-Aid    Post-procedure details: Patient was observed during the procedure. Post-procedure instructions were reviewed.  Patient left the clinic in stable condition.

## 2022-01-23 ENCOUNTER — Telehealth: Payer: Self-pay

## 2022-01-23 DIAGNOSIS — F411 Generalized anxiety disorder: Secondary | ICD-10-CM

## 2022-01-23 NOTE — Telephone Encounter (Signed)
Pt requested Rx for her app on 02/08/22, Please advise

## 2022-01-26 ENCOUNTER — Other Ambulatory Visit (HOSPITAL_BASED_OUTPATIENT_CLINIC_OR_DEPARTMENT_OTHER): Payer: Self-pay

## 2022-01-26 MED ORDER — DIAZEPAM 5 MG PO TABS
ORAL_TABLET | ORAL | 0 refills | Status: DC
  Filled 2022-01-26: qty 2, 1d supply, fill #0

## 2022-01-26 NOTE — Telephone Encounter (Signed)
Pre-procedure diazepam ordered for pre-operative anxiety.  

## 2022-01-26 NOTE — Addendum Note (Signed)
Addended by: Ashok Norris on: 01/26/2022 09:06 AM   Modules accepted: Orders

## 2022-01-30 ENCOUNTER — Other Ambulatory Visit: Payer: Self-pay | Admitting: Surgical

## 2022-01-30 ENCOUNTER — Other Ambulatory Visit (HOSPITAL_BASED_OUTPATIENT_CLINIC_OR_DEPARTMENT_OTHER): Payer: Self-pay

## 2022-01-31 ENCOUNTER — Other Ambulatory Visit (HOSPITAL_BASED_OUTPATIENT_CLINIC_OR_DEPARTMENT_OTHER): Payer: Self-pay

## 2022-01-31 ENCOUNTER — Other Ambulatory Visit: Payer: Self-pay | Admitting: Surgical

## 2022-01-31 MED ORDER — TRAMADOL HCL 50 MG PO TABS
25.0000 mg | ORAL_TABLET | Freq: Two times a day (BID) | ORAL | 0 refills | Status: DC | PRN
Start: 2022-01-31 — End: 2022-02-27
  Filled 2022-01-31: qty 30, 15d supply, fill #0

## 2022-02-08 ENCOUNTER — Ambulatory Visit: Payer: 59 | Admitting: Physical Medicine and Rehabilitation

## 2022-02-08 ENCOUNTER — Other Ambulatory Visit: Payer: Self-pay

## 2022-02-08 ENCOUNTER — Ambulatory Visit: Payer: Self-pay

## 2022-02-08 ENCOUNTER — Encounter: Payer: Self-pay | Admitting: Physical Medicine and Rehabilitation

## 2022-02-08 VITALS — BP 119/66 | HR 105

## 2022-02-08 DIAGNOSIS — M47816 Spondylosis without myelopathy or radiculopathy, lumbar region: Secondary | ICD-10-CM

## 2022-02-08 MED ORDER — BUPIVACAINE HCL 0.5 % IJ SOLN
3.0000 mL | Freq: Once | INTRAMUSCULAR | Status: AC
  Administered 2022-02-08: 3 mL

## 2022-02-08 NOTE — Progress Notes (Signed)
Pt state lower back pain that travels to both hips. Pt state walking, standing and lifting makes the pain worse. Pt state she takes pain meds to help ease her pain. ? ?Numeric Pain Rating Scale and Functional Assessment ?Average Pain 5 ? ? ?In the last MONTH (on 0-10 scale) has pain interfered with the following? ? ?1. General activity like being  able to carry out your everyday physical activities such as walking, climbing stairs, carrying groceries, or moving a chair?  ?Rating(7) ? ? ?+Driver, -BT, -Dye Allergies. ? ?

## 2022-02-08 NOTE — Patient Instructions (Signed)

## 2022-02-14 ENCOUNTER — Encounter: Payer: Self-pay | Admitting: Physical Medicine and Rehabilitation

## 2022-02-19 NOTE — Progress Notes (Signed)
? ?Julia Walter - 46 y.o. female MRN [id number]  Date of birth: 1976/10/03 ? ?Office Visit Note: ?Visit Date: 02/08/2022 ?PCP: Patient, No Pcp Per (Inactive) ?Referred by: Tyrell Antonio, MD ? ?Subjective: ?Chief Complaint  ?Patient presents with  ? Lower Back - Pain  ? Right Hip - Pain  ? Left Hip - Pain  ? ?HPI:  Julia Walter is a 46 y.o. female who comes in today for planned repeat Bilateral L4-5 Lumbar facet/medial branch block with fluoroscopic guidance.  The patient has failed conservative care including home exercise, medications, time and activity modification.  This injection will be diagnostic and hopefully therapeutic.  Please see requesting physician notes for further details and justification.  Exam shows concordant low back pain with facet joint loading and extension. Patient received more than 80% pain relief from prior injection. This would be the second block in a diagnostic double block paradigm. ? ? ? ? ?Referring:Dr. Burnard Bunting and Ellin Goodie, FNP  ? ? ? ?ROS Otherwise per HPI. ? ?Assessment & Plan: ?Visit Diagnoses:  ?  ICD-10-CM   ?1. Spondylosis without myelopathy or radiculopathy, lumbar region  M47.816 XR C-ARM NO REPORT  ?  Facet Injection  ?  bupivacaine (MARCAINE) 0.5 % (with pres) injection 3 mL  ?  ?  ?Plan: No additional findings.  ? ?Meds & Orders:  ?Meds ordered this encounter  ?Medications  ? bupivacaine (MARCAINE) 0.5 % (with pres) injection 3 mL  ?  ?Orders Placed This Encounter  ?Procedures  ? Facet Injection  ? XR C-ARM NO REPORT  ?  ?Follow-up: Return for Review Pain Diary.  ? ?Procedures: ?No procedures performed  ?Lumbar Diagnostic Facet Joint Nerve Block with Fluoroscopic Guidance  ? ?Patient: Julia Walter      ?Date of Birth: 1976-08-12 ?MRN: [id number] ?PCP: Patient, No Pcp Per (Inactive)      ?Visit Date: 02/08/2022 ?  ?Universal Protocol:    ?Date/Time: 03/13/235:49 AM ? ?Consent Given By: the patient ? ?Position: PRONE ? ?Additional Comments: ?Vital  signs were monitored before and after the procedure. ?Patient was prepped and draped in the usual sterile fashion. ?The correct patient, procedure, and site was verified. ? ? ?Injection Procedure Details:  ? ?Procedure diagnoses:  ?1. Spondylosis without myelopathy or radiculopathy, lumbar region   ?  ? ?Meds Administered:  ?Meds ordered this encounter  ?Medications  ? bupivacaine (MARCAINE) 0.5 % (with pres) injection 3 mL  ?  ? ?Laterality: Bilateral ? ?Location/Site: L4-L5, L3 and L4 medial branches ? ?Needle: 5.0 in., 25 ga.  Short bevel or Quincke spinal needle ? ?Needle Placement: Oblique pedical ? ?Findings: ? ? -Comments: There was excellent flow of contrast along the articular pillars without intravascular flow. ? ?Procedure Details: ?The fluoroscope beam is vertically oriented in AP and then obliqued 15 to 20 degrees to the ipsilateral side of the desired nerve to achieve the ?Scotty dog? appearance.  The skin over the target area of the junction of the superior articulating process and the transverse process (sacral ala if blocking the L5 dorsal rami) was locally anesthetized with a 1 ml volume of 1% Lidocaine without Epinephrine.  The spinal needle was inserted and advanced in a trajectory view down to the target.  ? ?After contact with periosteum and negative aspirate for blood and CSF, correct placement without intravascular or epidural spread was confirmed by injecting 0.5 ml. of Isovue-250.  A spot radiograph was obtained of this image.   ? ?Next, a  0.5 ml. volume of the injectate described above was injected. The needle was then redirected to the other facet joint nerves mentioned above if needed. ? ?Prior to the procedure, the patient was given a Pain Diary which was completed for baseline measurements.  After the procedure, the patient rated their pain every 30 minutes and will continue rating at this frequency for a total of 5 hours.  The patient has been asked to complete the Diary and return to  [place] by mail, fax or hand delivered as soon as possible. ? ? ?Additional Comments:  ?No complications occurred ?Dressing: 2 x 2 sterile gauze and Band-Aid ?  ? ?Post-procedure details: ?Patient was observed during the procedure. ?Post-procedure instructions were reviewed. ? ?Patient left the clinic in stable condition.   ? ?Clinical History: ?MRI LUMBAR SPINE WITHOUT CONTRAST ?  ?TECHNIQUE: ?Multiplanar, multisequence MR imaging of the lumbar spine was ?performed. No intravenous contrast was administered. ?  ?COMPARISON:  Radiography 09/29/2021 ?  ?FINDINGS: ?Segmentation: L5 has some transitional features. Correlation with ?this numbering scheme would be important should intervention be ?contemplated. ?  ?Alignment:  1 or 2 mm of anterolisthesis L4-5 and L5-S1. ?  ?Vertebrae:  No fracture or focal bone lesion. ?  ?Conus medullaris and cauda equina: Conus extends to the L1 level. ?Conus and cauda equina appear normal. ?  ?Paraspinal and other soft tissues: Negative ?  ?Disc levels: ?  ?No abnormality at L3-4 or above. ?  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 ?Mild bulging of the disc towards the left. Mild narrowing of the ?left lateral recess and intervertebral foramen on the left but ?without definite neural compression ?  ?L5-S1: Somewhat transitional level. Mild bulging of the disc. ?Minimal facet hypertrophy. No compressive stenosis. ?  ?IMPRESSION: ?L5 is a transitional vertebra. ?  ?At L4-5, there is bilateral facet osteoarthritis with mild edema and ?2 mm of anterolisthesis. There is bulging of the disc slightly more ?towards the left. Mild narrowing of the left lateral recess and ?intervertebral foramen on the left. Definite neural compression is ?not established, but left-sided neural irritation could possibly ?occur. Additionally, the facet arthritis could be a cause of back ?pain or referred facet syndrome pain. ?  ?  ?Electronically Signed ?  By: Julia Walter M.D. ?  On:  12/18/2021 16:04  ? ? ? ?Objective:  VS:  HT:    WT:   BMI:     BP:119/66  HR:(!) 105bpm  TEMP: ( )  RESP:  ?Physical Exam ?Vitals and nursing note reviewed.  ?Constitutional:   ?   General: She is not in acute distress. ?   Appearance: Normal appearance. She is not ill-appearing.  ?HENT:  ?   Head: Normocephalic and atraumatic.  ?   Right Ear: External ear normal.  ?   Left Ear: External ear normal.  ?Eyes:  ?   Extraocular Movements: Extraocular movements intact.  ?Cardiovascular:  ?   Rate and Rhythm: Normal rate.  ?   Pulses: Normal pulses.  ?Pulmonary:  ?   Effort: Pulmonary effort is normal. No respiratory distress.  ?Abdominal:  ?   General: There is no distension.  ?   Palpations: Abdomen is soft.  ?Musculoskeletal:     ?   General: Tenderness present.  ?   Cervical back: Neck supple.  ?   Right lower leg: No edema.  ?   Left lower leg: No edema.  ?   Comments: Patient has good distal strength  with no pain over the greater trochanters.  No clonus or focal weakness. Patient somewhat slow to rise from a seated position to full extension.  There is concordant low back pain with facet loading and lumbar spine extension rotation.  There are no definitive trigger points but the patient is somewhat tender across the lower back and PSIS.  There is no pain with hip rotation.   ?Skin: ?   Findings: No erythema, lesion or rash.  ?Neurological:  ?   General: No focal deficit present.  ?   Mental Status: She is alert and oriented to person, place, and time.  ?   Sensory: No sensory deficit.  ?   Motor: No weakness or abnormal muscle tone.  ?   Coordination: Coordination normal.  ?Psychiatric:     ?   Mood and Affect: Mood normal.     ?   Behavior: Behavior normal.  ?  ? ?Imaging: ?No results found. ?

## 2022-02-19 NOTE — Procedures (Signed)
Lumbar Diagnostic Facet Joint Nerve Block with Fluoroscopic Guidance  ? ?Patient: Julia Walter      ?Date of Birth: 1976/11/20 ?MRN: [id number] ?PCP: Patient, No Pcp Per (Inactive)      ?Visit Date: 02/08/2022 ?  ?Universal Protocol:    ?Date/Time: 03/13/235:49 AM ? ?Consent Given By: the patient ? ?Position: PRONE ? ?Additional Comments: ?Vital signs were monitored before and after the procedure. ?Patient was prepped and draped in the usual sterile fashion. ?The correct patient, procedure, and site was verified. ? ? ?Injection Procedure Details:  ? ?Procedure diagnoses:  ?1. Spondylosis without myelopathy or radiculopathy, lumbar region   ?  ? ?Meds Administered:  ?Meds ordered this encounter  ?Medications  ? bupivacaine (MARCAINE) 0.5 % (with pres) injection 3 mL  ?  ? ?Laterality: Bilateral ? ?Location/Site: L4-L5, L3 and L4 medial branches ? ?Needle: 5.0 in., 25 ga.  Short bevel or Quincke spinal needle ? ?Needle Placement: Oblique pedical ? ?Findings: ? ? -Comments: There was excellent flow of contrast along the articular pillars without intravascular flow. ? ?Procedure Details: ?The fluoroscope beam is vertically oriented in AP and then obliqued 15 to 20 degrees to the ipsilateral side of the desired nerve to achieve the ?Scotty dog? appearance.  The skin over the target area of the junction of the superior articulating process and the transverse process (sacral ala if blocking the L5 dorsal rami) was locally anesthetized with a 1 ml volume of 1% Lidocaine without Epinephrine.  The spinal needle was inserted and advanced in a trajectory view down to the target.  ? ?After contact with periosteum and negative aspirate for blood and CSF, correct placement without intravascular or epidural spread was confirmed by injecting 0.5 ml. of Isovue-250.  A spot radiograph was obtained of this image.   ? ?Next, a 0.5 ml. volume of the injectate described above was injected. The needle was then redirected to the other  facet joint nerves mentioned above if needed. ? ?Prior to the procedure, the patient was given a Pain Diary which was completed for baseline measurements.  After the procedure, the patient rated their pain every 30 minutes and will continue rating at this frequency for a total of 5 hours.  The patient has been asked to complete the Diary and return to [place] by mail, fax or hand delivered as soon as possible. ? ? ?Additional Comments:  ?No complications occurred ?Dressing: 2 x 2 sterile gauze and Band-Aid ?  ? ?Post-procedure details: ?Patient was observed during the procedure. ?Post-procedure instructions were reviewed. ? ?Patient left the clinic in stable condition.  ?

## 2022-02-27 ENCOUNTER — Other Ambulatory Visit (HOSPITAL_BASED_OUTPATIENT_CLINIC_OR_DEPARTMENT_OTHER): Payer: Self-pay

## 2022-02-27 ENCOUNTER — Other Ambulatory Visit: Payer: Self-pay | Admitting: Surgical

## 2022-02-28 ENCOUNTER — Other Ambulatory Visit (HOSPITAL_BASED_OUTPATIENT_CLINIC_OR_DEPARTMENT_OTHER): Payer: Self-pay

## 2022-02-28 MED ORDER — TRAMADOL HCL 50 MG PO TABS
25.0000 mg | ORAL_TABLET | Freq: Two times a day (BID) | ORAL | 0 refills | Status: DC | PRN
Start: 2022-02-28 — End: 2022-04-05
  Filled 2022-02-28: qty 30, 15d supply, fill #0

## 2022-03-26 IMAGING — MR MR LUMBAR SPINE W/O CM
4 of 5 series · 18 of 48 positions shown · non-contrast
Comparison: Radiography 09/29/2021

CLINICAL DATA: Back, sacroiliac region and hip pain over the last
year. Side not specified.

EXAM:
MRI LUMBAR SPINE WITHOUT CONTRAST
TECHNIQUE: Multiplanar, multisequence MR imaging of the lumbar spine was
performed. No intravenous contrast was administered.

[Series 5: T2 · sagittal · 4.0mm · 0.73mm/px · 6 of 15 slices shown (1 of 2)]
[im 1/15]
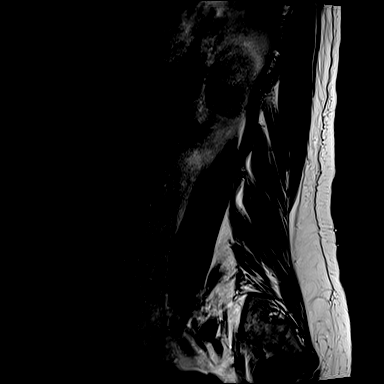
[im 3/15]
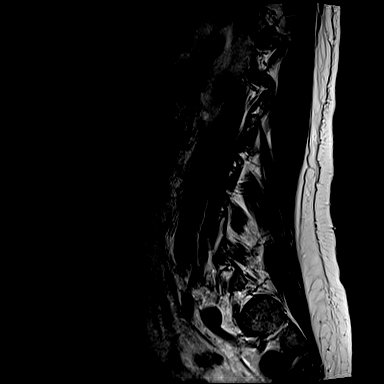
[im 6/15]
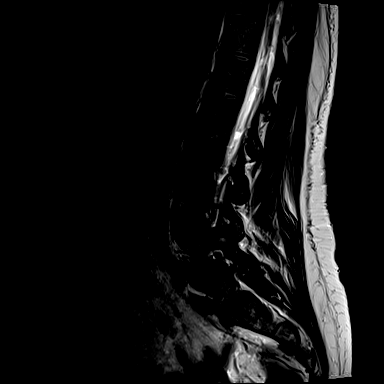
[im 9/15]
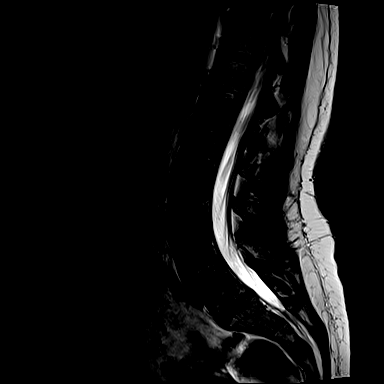
[im 12/15]
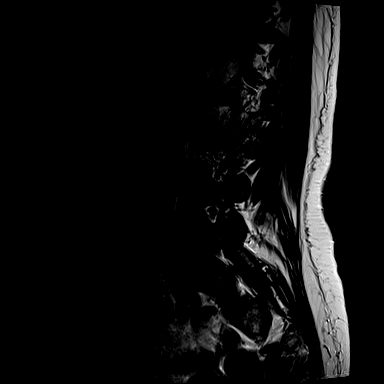
[im 15/15]
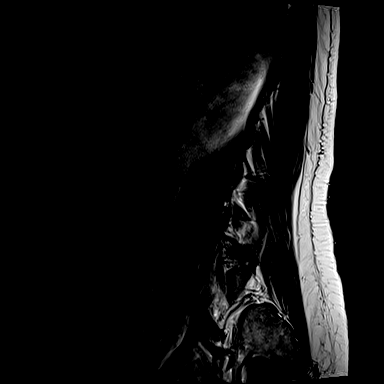

[Series 6: T1 · sagittal · 4.0mm · 0.73mm/px · 3 of 15 slices shown (1 of 2)]
[im 3/15]
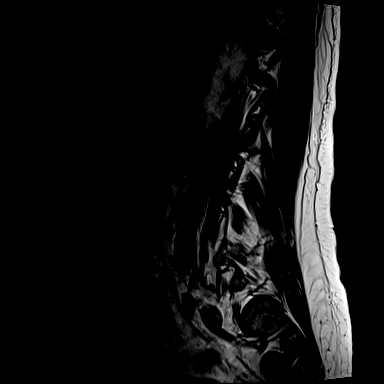
[im 9/15]
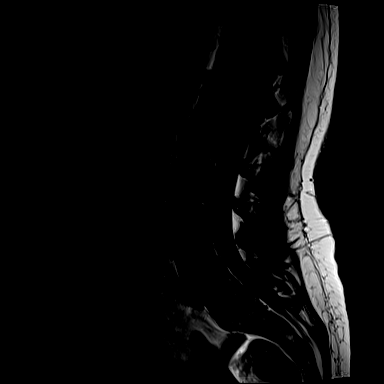
[im 15/15]
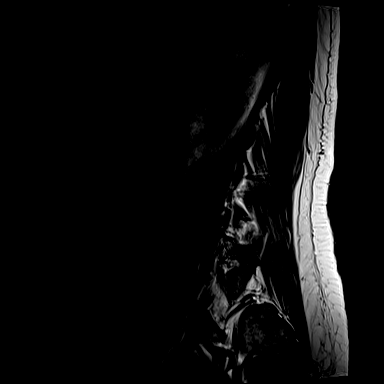

[Series 10: T1 · axial · 4.0mm · 0.28mm/px · z∈[+19,+190]mm · 3 of 39 slices shown (2 of 2)]
[im 6/39]
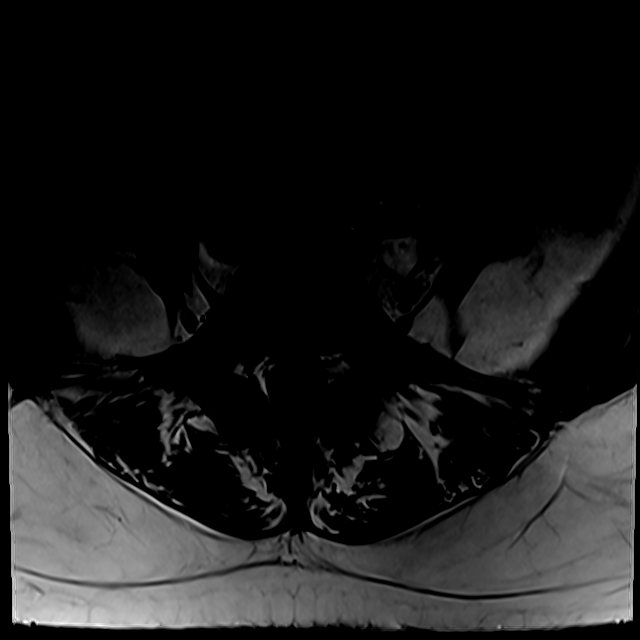
[im 20/39]
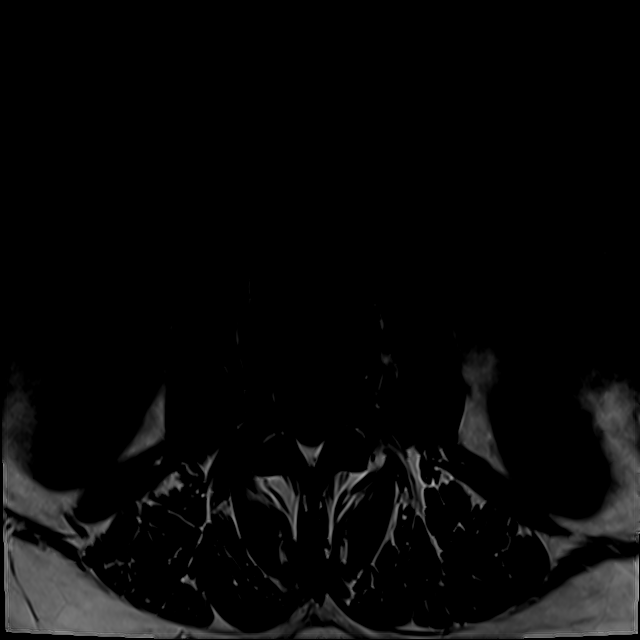
[im 33/39]
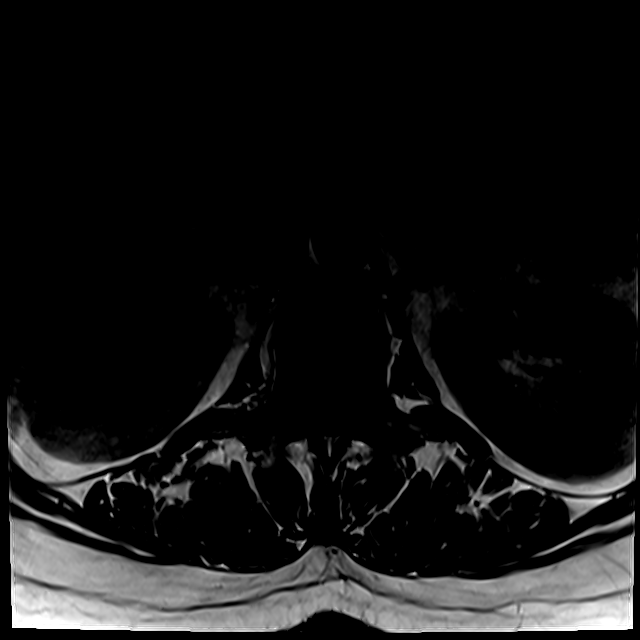

[Series 13: T2 · axial · 4.0mm · 0.28mm/px · z∈[-4,+190]mm · 6 of 39 slices shown (2 of 2)]
[im 1/39]
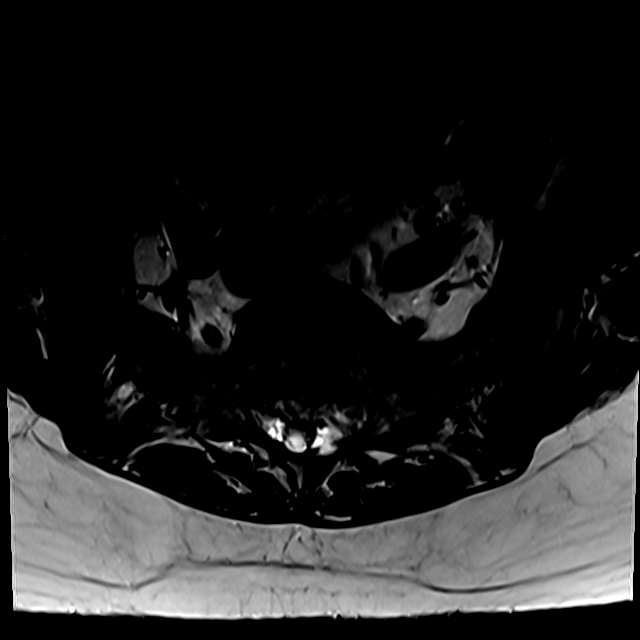
[im 6/39]
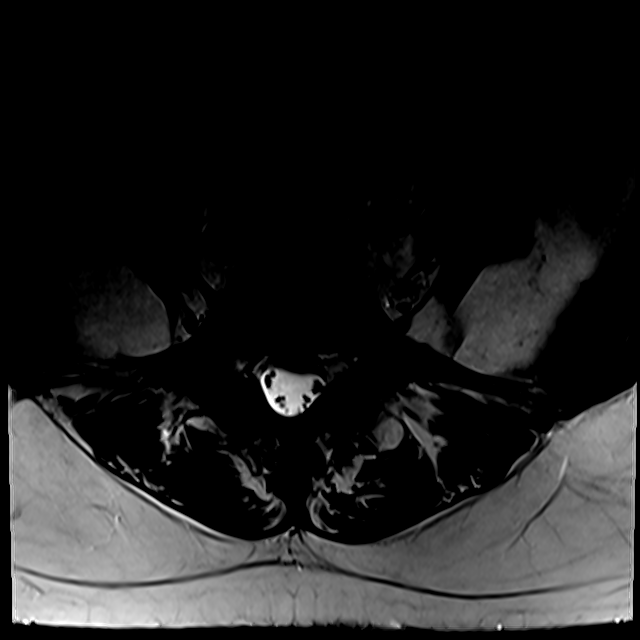
[im 11/39]
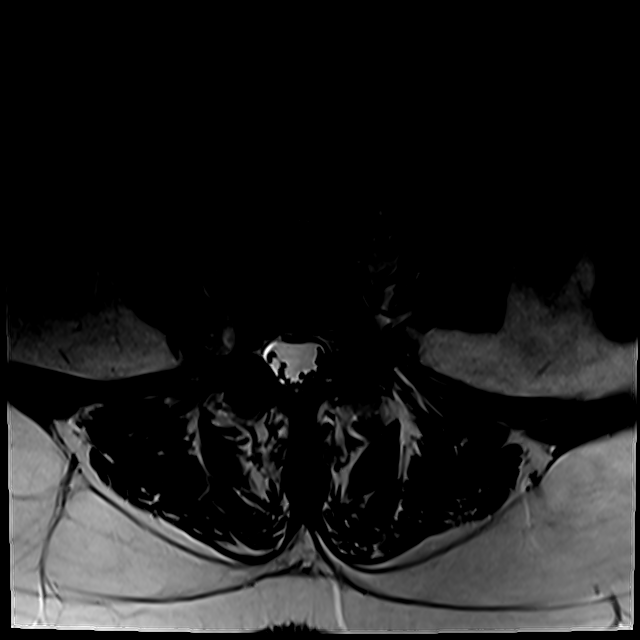
[im 17/39]
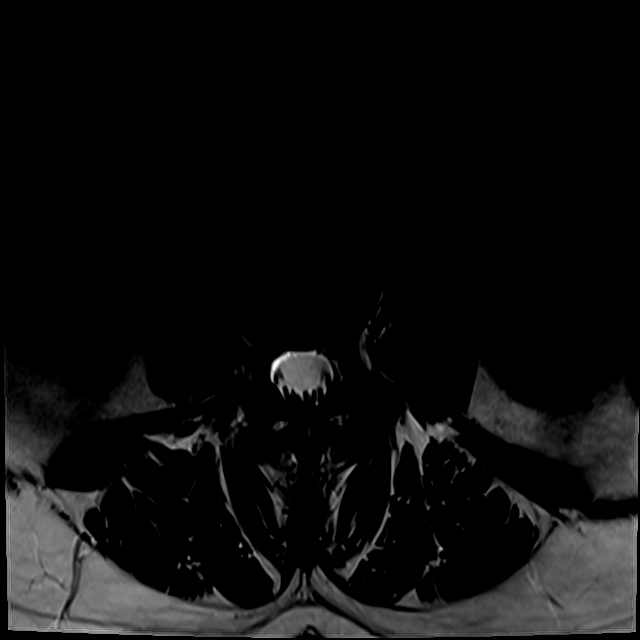
[im 20/39]
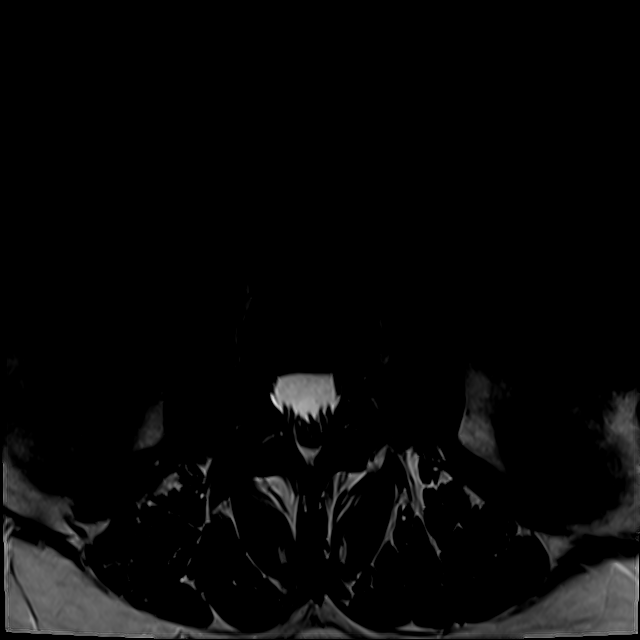
[im 33/39]
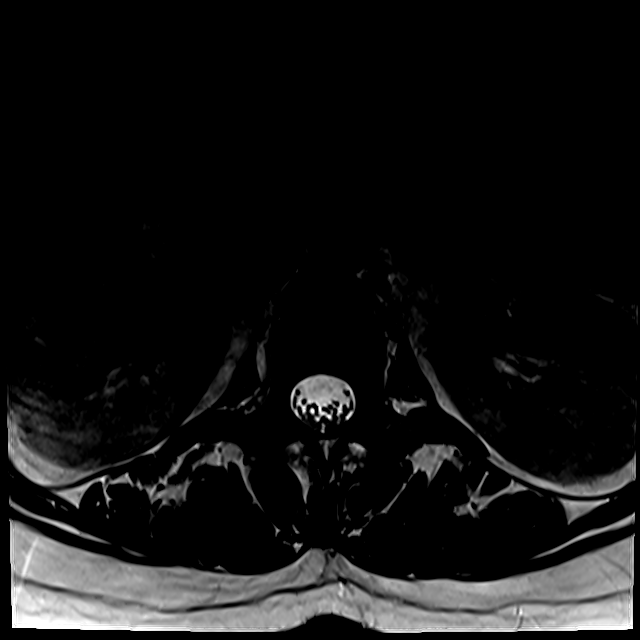

[18 of 48 positions shown; findings below may reference images not displayed]

FINDINGS: Segmentation: L5 has some transitional features. Correlation with
this numbering scheme would be important should intervention be
contemplated.

Alignment:  1 or 2 mm of anterolisthesis L4-5 and L5-S1.

Vertebrae:  No fracture or focal bone lesion.

Conus medullaris and cauda equina: Conus extends to the L1 level.
Conus and cauda equina appear normal.

Paraspinal and other soft tissues: Negative

Disc levels:

No abnormality at L3-4 or above.

L4-5: Bilateral facet osteoarthritis with 2 mm of anterolisthesis.
Mild bulging of the disc towards the left. Mild narrowing of the
left lateral recess and intervertebral foramen on the left but
without definite neural compression

L5-S1: Somewhat transitional level. Mild bulging of the disc.
Minimal facet hypertrophy. No compressive stenosis.
IMPRESSION: L5 is a transitional vertebra.

At L4-5, there is bilateral facet osteoarthritis with mild edema and
2 mm of anterolisthesis. There is bulging of the disc slightly more
towards the left. Mild narrowing of the left lateral recess and
intervertebral foramen on the left. Definite neural compression is
not established, but left-sided neural irritation could possibly
occur. Additionally, the facet arthritis could be a cause of back
pain or referred facet syndrome pain.

## 2022-04-02 ENCOUNTER — Other Ambulatory Visit (HOSPITAL_BASED_OUTPATIENT_CLINIC_OR_DEPARTMENT_OTHER): Payer: Self-pay

## 2022-04-05 ENCOUNTER — Other Ambulatory Visit (HOSPITAL_BASED_OUTPATIENT_CLINIC_OR_DEPARTMENT_OTHER): Payer: Self-pay

## 2022-04-05 ENCOUNTER — Other Ambulatory Visit: Payer: Self-pay | Admitting: Surgical

## 2022-04-05 ENCOUNTER — Encounter: Payer: Self-pay | Admitting: Physical Medicine and Rehabilitation

## 2022-04-05 MED ORDER — DIAZEPAM 5 MG PO TABS
ORAL_TABLET | ORAL | 0 refills | Status: DC
  Filled 2022-04-05: qty 2, 2d supply, fill #0

## 2022-04-05 MED ORDER — TRAMADOL HCL 50 MG PO TABS
25.0000 mg | ORAL_TABLET | Freq: Two times a day (BID) | ORAL | 0 refills | Status: AC | PRN
Start: 2022-04-05 — End: ?
  Filled 2022-04-05: qty 30, 15d supply, fill #0

## 2022-04-09 ENCOUNTER — Other Ambulatory Visit (HOSPITAL_BASED_OUTPATIENT_CLINIC_OR_DEPARTMENT_OTHER): Payer: Self-pay

## 2022-04-09 ENCOUNTER — Encounter: Payer: Self-pay | Admitting: Physical Medicine and Rehabilitation

## 2022-04-09 ENCOUNTER — Ambulatory Visit: Payer: Self-pay

## 2022-04-09 ENCOUNTER — Ambulatory Visit: Payer: 59 | Admitting: Physical Medicine and Rehabilitation

## 2022-04-09 VITALS — BP 95/69 | HR 102

## 2022-04-09 DIAGNOSIS — M47816 Spondylosis without myelopathy or radiculopathy, lumbar region: Secondary | ICD-10-CM

## 2022-04-09 MED ORDER — METHYLPREDNISOLONE ACETATE 80 MG/ML IJ SUSP
80.0000 mg | Freq: Once | INTRAMUSCULAR | Status: AC
  Administered 2022-04-09: 80 mg

## 2022-04-09 MED ORDER — DIAZEPAM 5 MG PO TABS
ORAL_TABLET | ORAL | 0 refills | Status: AC
  Filled 2022-04-09: qty 2, 1d supply, fill #0

## 2022-04-09 NOTE — Patient Instructions (Signed)

## 2022-04-09 NOTE — Progress Notes (Signed)
Pt state lower back pain that travels to both hips. Pt state walking, standing and lifting makes the pain worse. Pt state she takes pain meds to help ease her pain ? ?Numeric Pain Rating Scale and Functional Assessment ?Average Pain 7 ? ? ?In the last MONTH (on 0-10 scale) has pain interfered with the following? ? ?1. General activity like being  able to carry out your everyday physical activities such as walking, climbing stairs, carrying groceries, or moving a chair?  ?Rating(10) ? ? ?+Driver, -BT, -Dye Allergies. ? ?

## 2022-04-16 ENCOUNTER — Ambulatory Visit: Payer: Self-pay

## 2022-04-16 ENCOUNTER — Ambulatory Visit: Payer: 59 | Admitting: Physical Medicine and Rehabilitation

## 2022-04-16 ENCOUNTER — Encounter: Payer: Self-pay | Admitting: Physical Medicine and Rehabilitation

## 2022-04-16 VITALS — BP 113/79 | HR 86

## 2022-04-16 DIAGNOSIS — M47816 Spondylosis without myelopathy or radiculopathy, lumbar region: Secondary | ICD-10-CM | POA: Diagnosis not present

## 2022-04-16 MED ORDER — METHYLPREDNISOLONE ACETATE 80 MG/ML IJ SUSP
80.0000 mg | Freq: Once | INTRAMUSCULAR | Status: AC
  Administered 2022-04-16: 80 mg

## 2022-04-19 NOTE — Procedures (Signed)
Lumbar Facet Joint Nerve Denervation ? ?Patient: Julia Walter      ?Date of Birth: 07-26-1976 ?MRN: [id number] ?PCP: Patient, No Pcp Per (Inactive)      ?Visit Date: 04/09/2022 ?  ?Universal Protocol:    ?Date/Time: 05/11/236:04 AM ? ?Consent Given By: the patient ? ?Position: PRONE ? ?Additional Comments: ?Vital signs were monitored before and after the procedure. ?Patient was prepped and draped in the usual sterile fashion. ?The correct patient, procedure, and site was verified. ? ? ?Injection Procedure Details:  ? ?Procedure diagnoses:  ?1. Spondylosis without myelopathy or radiculopathy, lumbar region   ?  ? ?Meds Administered:  ?Meds ordered this encounter  ?Medications  ? methylPREDNISolone acetate (DEPO-MEDROL) injection 80 mg  ? diazepam (VALIUM) 5 MG tablet  ?  Sig: Take 1 by mouth 1 hour  pre-procedure with very light food. May bring 2nd tablet to appointment.  ?  Dispense:  2 tablet  ?  Refill:  0  ?  ? ?Laterality: Right ? ?Location/Site:  L4-L5, L3 and L4 medial branches   patient has partially sacralized L5 segment. ? ?Needle: 18 ga.,  31mm active tip, RF Cannula ? ?Needle Placement: Along juncture of superior articular process and transverse pocess ? ?Findings: ? -Comments: ? ?Procedure Details: ?For each desired target nerve, the corresponding transverse process (sacral ala for the L5 dorsal rami) was identified and the fluoroscope was positioned to square off the endplates of the corresponding vertebral body to achieve a true AP midline view.  The beam was then obliqued 15 to 20 degrees and caudally tilted 15 to 20 degrees to line up a trajectory along the target nerves. The skin over the target of the junction of superior articulating process and transverse process (sacral ala for the L5 dorsal rami) was infiltrated with 28ml of 1% Lidocaine without Epinephrine.  The 18 gauge 16mm active tip outer cannula was advanced in trajectory view to the target. ? ?This procedure was repeated for each  target nerve.  Then, for all levels, the outer cannula placement was fine-tuned and the position was then confirmed with bi-planar imaging.   ? ?Test stimulation was done both at sensory and motor levels to ensure there was no radicular stimulation. The target tissues were then infiltrated with 1 ml of 1% Lidocaine without Epinephrine. Subsequently, a percutaneous neurotomy was carried out for 90 seconds at 80 degrees Celsius.  After the completion of the lesion, 1 ml of injectate was delivered. It was then repeated for each facet joint nerve mentioned above. Appropriate radiographs were obtained to verify the probe placement during the neurotomy. ? ? ?Additional Comments:  ?No complications occurred ?Dressing: 2 x 2 sterile gauze and Band-Aid ?  ? ?Post-procedure details: ?Patient was observed during the procedure. ?Post-procedure instructions were reviewed. ? ?Patient left the clinic in stable condition. ? ? ? ?

## 2022-04-19 NOTE — Progress Notes (Signed)
? ?Julia Walter - 46 y.o. female MRN [id number]  Date of birth: 21-May-1976 ? ?Office Visit Note: ?Visit Date: 04/09/2022 ?PCP: Patient, No Pcp Per (Inactive) ?Referred by: Tyrell AntonioNewton, Conchetta Lamia, MD ? ?Subjective: ?Chief Complaint  ?Patient presents with  ? Lower Back - Pain  ? Right Hip - Pain  ? Left Hip - Pain  ? ?HPI:  Julia Walter is a 46 y.o. female who comes in todayfor planned radiofrequency ablation of the Right L4-5 Lumbar facet joints. This would be ablation of the corresponding medial branches and/or dorsal rami.  Patient has had double diagnostic blocks with more than 50% relief.  These are documented on pain diary.  They have had chronic back pain for quite some time, more than 3 months, which has been an ongoing situation with recalcitrant axial back pain.  They have no radicular pain.  Their axial pain is worse with standing and ambulating and on exam today with facet loading.  They have had physical therapy as well as home exercise program.  The imaging noted in the chart below indicated facet pathology. Accordingly they meet all the criteria and qualification for for radiofrequency ablation and we are going to complete this today hopefully for more longer term relief as part of comprehensive management program.  ? ?ROS Otherwise per HPI. ? ?Assessment & Plan: ?Visit Diagnoses:  ?  ICD-10-CM   ?1. Spondylosis without myelopathy or radiculopathy, lumbar region  M47.816 XR C-ARM NO REPORT  ?  Radiofrequency,Lumbar  ?  methylPREDNISolone acetate (DEPO-MEDROL) injection 80 mg  ?  ?  ?Plan: No additional findings.  ? ?Meds & Orders:  ?Meds ordered this encounter  ?Medications  ? methylPREDNISolone acetate (DEPO-MEDROL) injection 80 mg  ? diazepam (VALIUM) 5 MG tablet  ?  Sig: Take 1 by mouth 1 hour  pre-procedure with very light food. May bring 2nd tablet to appointment.  ?  Dispense:  2 tablet  ?  Refill:  0  ?  ?Orders Placed This Encounter  ?Procedures  ? Radiofrequency,Lumbar  ? XR C-ARM NO REPORT   ?  ?Follow-up: Return if symptoms worsen or fail to improve.  ? ?Procedures: ?No procedures performed  ?Lumbar Facet Joint Nerve Denervation ? ?Patient: Julia Walter      ?Date of Birth: 21-May-1976 ?MRN: [id number] ?PCP: Patient, No Pcp Per (Inactive)      ?Visit Date: 04/09/2022 ?  ?Universal Protocol:    ?Date/Time: 05/11/236:04 AM ? ?Consent Given By: the patient ? ?Position: PRONE ? ?Additional Comments: ?Vital signs were monitored before and after the procedure. ?Patient was prepped and draped in the usual sterile fashion. ?The correct patient, procedure, and site was verified. ? ? ?Injection Procedure Details:  ? ?Procedure diagnoses:  ?1. Spondylosis without myelopathy or radiculopathy, lumbar region   ?  ? ?Meds Administered:  ?Meds ordered this encounter  ?Medications  ? methylPREDNISolone acetate (DEPO-MEDROL) injection 80 mg  ? diazepam (VALIUM) 5 MG tablet  ?  Sig: Take 1 by mouth 1 hour  pre-procedure with very light food. May bring 2nd tablet to appointment.  ?  Dispense:  2 tablet  ?  Refill:  0  ?  ? ?Laterality: Right ? ?Location/Site:  L4-L5, L3 and L4 medial branches   patient has partially sacralized L5 segment. ? ?Needle: 18 ga.,  10mm active tip, 100mm RF Cannula ? ?Needle Placement: Along juncture of superior articular process and transverse pocess ? ?Findings: ? -Comments: ? ?Procedure Details: ?For each desired target nerve, the corresponding  transverse process (sacral ala for the L5 dorsal rami) was identified and the fluoroscope was positioned to square off the endplates of the corresponding vertebral body to achieve a true AP midline view.  The beam was then obliqued 15 to 20 degrees and caudally tilted 15 to 20 degrees to line up a trajectory along the target nerves. The skin over the target of the junction of superior articulating process and transverse process (sacral ala for the L5 dorsal rami) was infiltrated with 22ml of 1% Lidocaine without Epinephrine.  The 18 gauge 60mm active  tip outer cannula was advanced in trajectory view to the target. ? ?This procedure was repeated for each target nerve.  Then, for all levels, the outer cannula placement was fine-tuned and the position was then confirmed with bi-planar imaging.   ? ?Test stimulation was done both at sensory and motor levels to ensure there was no radicular stimulation. The target tissues were then infiltrated with 1 ml of 1% Lidocaine without Epinephrine. Subsequently, a percutaneous neurotomy was carried out for 90 seconds at 80 degrees Celsius.  After the completion of the lesion, 1 ml of injectate was delivered. It was then repeated for each facet joint nerve mentioned above. Appropriate radiographs were obtained to verify the probe placement during the neurotomy. ? ? ?Additional Comments:  ?No complications occurred ?Dressing: 2 x 2 sterile gauze and Band-Aid ?  ? ?Post-procedure details: ?Patient was observed during the procedure. ?Post-procedure instructions were reviewed. ? ?Patient left the clinic in stable condition. ? ? ?  ? ?Clinical History: ?MRI LUMBAR SPINE WITHOUT CONTRAST ?  ?TECHNIQUE: ?Multiplanar, multisequence MR imaging of the lumbar spine was ?performed. No intravenous contrast was administered. ?  ?COMPARISON:  Radiography 09/29/2021 ?  ?FINDINGS: ?Segmentation: L5 has some transitional features. Correlation with ?this numbering scheme would be important should intervention be ?contemplated. ?  ?Alignment:  1 or 2 mm of anterolisthesis L4-5 and L5-S1. ?  ?Vertebrae:  No fracture or focal bone lesion. ?  ?Conus medullaris and cauda equina: Conus extends to the L1 level. ?Conus and cauda equina appear normal. ?  ?Paraspinal and other soft tissues: Negative ?  ?Disc levels: ?  ?No abnormality at L3-4 or above. ?  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 ?Mild bulging of the disc towards the left. Mild narrowing of the ?left lateral recess and intervertebral foramen on the left  but ?without definite neural compression ?  ?L5-S1: Somewhat transitional level. Mild bulging of the disc. ?Minimal facet hypertrophy. No compressive stenosis. ?  ?IMPRESSION: ?L5 is a transitional vertebra. ?  ?At L4-5, there is bilateral facet osteoarthritis with mild edema and ?2 mm of anterolisthesis. There is bulging of the disc slightly more ?towards the left. Mild narrowing of the left lateral recess and ?intervertebral foramen on the left. Definite neural compression is ?not established, but left-sided neural irritation could possibly ?occur. Additionally, the facet arthritis could be a cause of back ?pain or referred facet syndrome pain. ?  ?  ?Electronically Signed ?  By: Paulina Fusi M.D. ?  On: 12/18/2021 16:04  ? ? ? ?Objective:  VS:  HT:    WT:   BMI:     BP:95/69  HR:(!) 102bpm  TEMP: ( )  RESP:  ?Physical Exam ?Vitals and nursing note reviewed.  ?Constitutional:   ?   General: She is not in acute distress. ?   Appearance: Normal appearance. She is not ill-appearing.  ?HENT:  ?   Head: Normocephalic  and atraumatic.  ?   Right Ear: External ear normal.  ?   Left Ear: External ear normal.  ?Eyes:  ?   Extraocular Movements: Extraocular movements intact.  ?Cardiovascular:  ?   Rate and Rhythm: Normal rate.  ?   Pulses: Normal pulses.  ?Pulmonary:  ?   Effort: Pulmonary effort is normal. No respiratory distress.  ?Abdominal:  ?   General: There is no distension.  ?   Palpations: Abdomen is soft.  ?Musculoskeletal:     ?   General: Tenderness present.  ?   Cervical back: Neck supple.  ?   Right lower leg: No edema.  ?   Left lower leg: No edema.  ?   Comments: Patient has good distal strength with no pain over the greater trochanters.  No clonus or focal weakness. Patient somewhat slow to rise from a seated position to full extension.  There is concordant low back pain with facet loading and lumbar spine extension rotation.  There are no definitive trigger points but the patient is somewhat tender  across the lower back and PSIS.  There is no pain with hip rotation.   ?Skin: ?   Findings: No erythema, lesion or rash.  ?Neurological:  ?   General: No focal deficit present.  ?   Mental Status: She is alert and or

## 2022-05-01 ENCOUNTER — Other Ambulatory Visit (HOSPITAL_BASED_OUTPATIENT_CLINIC_OR_DEPARTMENT_OTHER): Payer: Self-pay

## 2022-05-02 NOTE — Progress Notes (Signed)
Julia Walter - 46 y.o. female MRN [id number]  Date of birth: [birth year]  Office Visit Note: Visit Date: 04/16/2022 PCP: Patient, No Pcp Per (Inactive) Referred by: No ref. provider found  Subjective: Chief Complaint  Patient presents with   Lower Back - Pain   HPI:  LIZBET CIRRINCIONE is a 46 y.o. female who comes in todayfor planned radiofrequency ablation of the Left L4-5 Lumbar facet joints. This would be ablation of the corresponding medial branches and/or dorsal rami.  Patient has had double diagnostic blocks with more than 50% relief.  These are documented on pain diary.  They have had chronic back pain for quite some time, more than 3 months, which has been an ongoing situation with recalcitrant axial back pain.  They have no radicular pain.  Their axial pain is worse with standing and ambulating and on exam today with facet loading.  They have had physical therapy as well as home exercise program.  The imaging noted in the chart below indicated facet pathology. Accordingly they meet all the criteria and qualification for for radiofrequency ablation and we are going to complete this today hopefully for more longer term relief as part of comprehensive management program.   ROS Otherwise per HPI.  Assessment & Plan: Visit Diagnoses:    ICD-10-CM   1. Spondylosis without myelopathy or radiculopathy, lumbar region  M47.816 XR C-ARM NO REPORT    Radiofrequency,Lumbar    methylPREDNISolone acetate (DEPO-MEDROL) injection 80 mg      Plan: No additional findings.   Meds & Orders:  Meds ordered this encounter  Medications   methylPREDNISolone acetate (DEPO-MEDROL) injection 80 mg    Orders Placed This Encounter  Procedures   Radiofrequency,Lumbar   XR C-ARM NO REPORT    Follow-up: Return if symptoms worsen or fail to improve.   Procedures: No procedures performed  Lumbar Facet Joint Nerve Denervation  Patient: LUMA CLOPPER      Date of Birth: November 15, 1976 MRN:  [id number] PCP: Patient, No Pcp Per (Inactive)      Visit Date: 04/16/2022   Universal Protocol:    Date/Time: 05/24/235:53 AM  Consent Given By: the patient  Position: PRONE  Additional Comments: Vital signs were monitored before and after the procedure. Patient was prepped and draped in the usual sterile fashion. The correct patient, procedure, and site was verified.   Injection Procedure Details:   Procedure diagnoses:  1. Spondylosis without myelopathy or radiculopathy, lumbar region      Meds Administered:  Meds ordered this encounter  Medications   methylPREDNISolone acetate (DEPO-MEDROL) injection 80 mg     Laterality: Left  Location/Site:  L4-L5, L3 and L4 medial branches  Needle: 18 ga.,  23mm active tip, RF Cannula  Needle Placement: Along juncture of superior articular process and transverse pocess  Findings:  -Comments:  Procedure Details: For each desired target nerve, the corresponding transverse process (sacral ala for the L5 dorsal rami) was identified and the fluoroscope was positioned to square off the endplates of the corresponding vertebral body to achieve a true AP midline view.  The beam was then obliqued 15 to 20 degrees and caudally tilted 15 to 20 degrees to line up a trajectory along the target nerves. The skin over the target of the junction of superior articulating process and transverse process (sacral ala for the L5 dorsal rami) was infiltrated with 33ml of 1% Lidocaine without Epinephrine.  The 18 gauge 97mm active tip outer cannula was advanced in trajectory  view to the target.  This procedure was repeated for each target nerve.  Then, for all levels, the outer cannula placement was fine-tuned and the position was then confirmed with bi-planar imaging.    Test stimulation was done both at sensory and motor levels to ensure there was no radicular stimulation. The target tissues were then infiltrated with 1 ml of 1% Lidocaine without  Epinephrine. Subsequently, a percutaneous neurotomy was carried out for 90 seconds at 80 degrees Celsius.  After the completion of the lesion, 1 ml of injectate was delivered. It was then repeated for each facet joint nerve mentioned above. Appropriate radiographs were obtained to verify the probe placement during the neurotomy.   Additional Comments:  The patient tolerated the procedure well Dressing: 2 x 2 sterile gauze and Band-Aid    Post-procedure details: Patient was observed during the procedure. Post-procedure instructions were reviewed.  Patient left the clinic in stable condition.      Clinical History: MRI LUMBAR SPINE WITHOUT CONTRAST   TECHNIQUE: Multiplanar, multisequence MR imaging of the lumbar spine was performed. No intravenous contrast was administered.   COMPARISON:  Radiography 09/29/2021   FINDINGS: Segmentation: L5 has some transitional features. Correlation with this numbering scheme would be important should intervention be contemplated.   Alignment:  1 or 2 mm of anterolisthesis L4-5 and L5-S1.   Vertebrae:  No fracture or focal bone lesion.   Conus medullaris and cauda equina: Conus extends to the L1 level. Conus and cauda equina appear normal.   Paraspinal and other soft tissues: Negative   Disc levels:   No abnormality at L3-4 or above.   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 Mild bulging of the disc towards the left. Mild narrowing of the left lateral recess and intervertebral foramen on the left but without definite neural compression   L5-S1: Somewhat transitional level. Mild bulging of the disc. Minimal facet hypertrophy. No compressive stenosis.   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   At L4-5, there is bilateral facet osteoarthritis with mild edema and 2 mm of anterolisthesis. There is bulging of the disc slightly more towards the left. Mild narrowing of the left lateral recess  and intervertebral foramen on the left. Definite neural compression is not established, but left-sided neural irritation could possibly occur. Additionally, the facet arthritis could be a cause of back pain or referred facet syndrome pain.     Electronically Signed   By: Paulina Fusi M.D.   On: 12/18/2021 16:04     Objective:  VS:  HT:    WT:   BMI:     BP:113/79  HR:86bpm  TEMP: ( )  RESP:96 % Physical Exam Vitals and nursing note reviewed.  Constitutional:      General: She is not in acute distress.    Appearance: Normal appearance. She is not ill-appearing.  HENT:     Head: Normocephalic and atraumatic.     Right Ear: External ear normal.     Left Ear: External ear normal.  Eyes:     Extraocular Movements: Extraocular movements intact.  Cardiovascular:     Rate and Rhythm: Normal rate.     Pulses: Normal pulses.  Pulmonary:     Effort: Pulmonary effort is normal. No respiratory distress.  Abdominal:     General: There is no distension.     Palpations: Abdomen is soft.  Musculoskeletal:        General: Tenderness present.     Cervical back: Neck  supple.     Right lower leg: No edema.     Left lower leg: No edema.     Comments: Patient has good distal strength with no pain over the greater trochanters.  No clonus or focal weakness. Patient somewhat slow to rise from a seated position to full extension.  There is concordant low back pain with facet loading and lumbar spine extension rotation.  There are no definitive trigger points but the patient is somewhat tender across the lower back and PSIS.  There is no pain with hip rotation.   Skin:    Findings: No erythema, lesion or rash.  Neurological:     General: No focal deficit present.     Mental Status: She is alert and oriented to person, place, and time.     Sensory: No sensory deficit.     Motor: No weakness or abnormal muscle tone.     Coordination: Coordination normal.  Psychiatric:        Mood and Affect:  Mood normal.        Behavior: Behavior normal.     Imaging: No results found.

## 2022-05-02 NOTE — Procedures (Signed)
Lumbar Facet Joint Nerve Denervation  Patient: Julia Walter      Date of Birth: 26-Mar-1976 MRN: [id number] PCP: Patient, No Pcp Per (Inactive)      Visit Date: 04/16/2022   Universal Protocol:    Date/Time: 05/24/235:53 AM  Consent Given By: the patient  Position: PRONE  Additional Comments: Vital signs were monitored before and after the procedure. Patient was prepped and draped in the usual sterile fashion. The correct patient, procedure, and site was verified.   Injection Procedure Details:   Procedure diagnoses:  1. Spondylosis without myelopathy or radiculopathy, lumbar region      Meds Administered:  Meds ordered this encounter  Medications   methylPREDNISolone acetate (DEPO-MEDROL) injection 80 mg     Laterality: Left  Location/Site:  L4-L5, L3 and L4 medial branches  Needle: 18 ga.,  45mm active tip, RF Cannula  Needle Placement: Along juncture of superior articular process and transverse pocess  Findings:  -Comments:  Procedure Details: For each desired target nerve, the corresponding transverse process (sacral ala for the L5 dorsal rami) was identified and the fluoroscope was positioned to square off the endplates of the corresponding vertebral body to achieve a true AP midline view.  The beam was then obliqued 15 to 20 degrees and caudally tilted 15 to 20 degrees to line up a trajectory along the target nerves. The skin over the target of the junction of superior articulating process and transverse process (sacral ala for the L5 dorsal rami) was infiltrated with 58ml of 1% Lidocaine without Epinephrine.  The 18 gauge 74mm active tip outer cannula was advanced in trajectory view to the target.  This procedure was repeated for each target nerve.  Then, for all levels, the outer cannula placement was fine-tuned and the position was then confirmed with bi-planar imaging.    Test stimulation was done both at sensory and motor levels to ensure there was  no radicular stimulation. The target tissues were then infiltrated with 1 ml of 1% Lidocaine without Epinephrine. Subsequently, a percutaneous neurotomy was carried out for 90 seconds at 80 degrees Celsius.  After the completion of the lesion, 1 ml of injectate was delivered. It was then repeated for each facet joint nerve mentioned above. Appropriate radiographs were obtained to verify the probe placement during the neurotomy.   Additional Comments:  The patient tolerated the procedure well Dressing: 2 x 2 sterile gauze and Band-Aid    Post-procedure details: Patient was observed during the procedure. Post-procedure instructions were reviewed.  Patient left the clinic in stable condition.

## 2022-06-19 ENCOUNTER — Other Ambulatory Visit (HOSPITAL_COMMUNITY): Payer: Self-pay

## 2022-10-31 ENCOUNTER — Other Ambulatory Visit (HOSPITAL_BASED_OUTPATIENT_CLINIC_OR_DEPARTMENT_OTHER): Payer: Self-pay

## 2024-09-01 ENCOUNTER — Other Ambulatory Visit (HOSPITAL_BASED_OUTPATIENT_CLINIC_OR_DEPARTMENT_OTHER): Payer: Self-pay

## 2024-09-01 MED ORDER — COMIRNATY 30 MCG/0.3ML IM SUSY
0.3000 mL | PREFILLED_SYRINGE | Freq: Once | INTRAMUSCULAR | 0 refills | Status: AC
  Filled 2024-09-01: qty 0.3, 1d supply, fill #0
